# Patient Record
Sex: Female | Born: 1960 | Race: White | Hispanic: No | Marital: Single | Smoking: Former smoker
Health system: Southern US, Community
[De-identification: ages and names within clinical notes are randomized; demographics above are authoritative.]

## PROBLEM LIST (undated history)

## (undated) DIAGNOSIS — J449 Chronic obstructive pulmonary disease, unspecified: Secondary | ICD-10-CM

## (undated) DIAGNOSIS — R600 Localized edema: Secondary | ICD-10-CM

## (undated) HISTORY — PX: TONSILLECTOMY: SUR1361

---

## 2010-05-19 ENCOUNTER — Emergency Department (HOSPITAL_COMMUNITY)
Admission: EM | Admit: 2010-05-19 | Discharge: 2010-05-19 | Disposition: A | Discharge status: Home / Self Care | Payer: Self-pay | Attending: Emergency Medicine | Admitting: Emergency Medicine

## 2010-05-19 DIAGNOSIS — K029 Dental caries, unspecified: Secondary | ICD-10-CM | POA: Insufficient documentation

## 2010-05-19 DIAGNOSIS — K047 Periapical abscess without sinus: Secondary | ICD-10-CM | POA: Insufficient documentation

## 2010-05-19 DIAGNOSIS — R221 Localized swelling, mass and lump, neck: Secondary | ICD-10-CM | POA: Insufficient documentation

## 2010-05-19 DIAGNOSIS — R220 Localized swelling, mass and lump, head: Secondary | ICD-10-CM | POA: Insufficient documentation

## 2010-05-19 DIAGNOSIS — R51 Headache: Secondary | ICD-10-CM | POA: Insufficient documentation

## 2010-06-22 ENCOUNTER — Other Ambulatory Visit: Payer: Self-pay | Admitting: Obstetrics and Gynecology

## 2010-06-22 ENCOUNTER — Encounter (INDEPENDENT_AMBULATORY_CARE_PROVIDER_SITE_OTHER): Payer: Self-pay | Admitting: Obstetrics and Gynecology

## 2010-06-22 DIAGNOSIS — Z01419 Encounter for gynecological examination (general) (routine) without abnormal findings: Secondary | ICD-10-CM

## 2010-06-23 NOTE — Progress Notes (Signed)
NAME:  Evelyn Peterson, Evelyn Peterson                  ACCOUNT NO.:  [id number]  MEDICAL RECORD NO.:  [id number]           PATIENT TYPE:  A  LOCATION:  [facility]                   FACILITY:  [facility]  PHYSICIAN:  Catalina Antigua, MD     DATE OF BIRTH:  Jun 11, 1960  DATE OF SERVICE:  06/22/2010                                 CLINIC NOTE  This is a 50 year old postmenopausal, nulligravida patient who presents today for annual exam.  The patient is without any specific complaints. Denies abnormal bleeding or discharge.  The patient is not sexually active.  The patient gives a history of recent tooth abscess that has been treated with oral antibiotics.  The patient had also history of hip pain and states that following the completion of her antibiotic course the hip pain seems to have resolved and this was the reason why she was actually seeking medical care.  The patient does not have a primary care physician.  PAST MEDICAL HISTORY:  She denies.  MEDICAL HISTORY:  She has had a tonsillectomy.  PAST OBSTETRIC/GYNECOLOGICAL HISTORY:  She denies any cyst, fibroids, or any abnormal Pap smear.  She has been menopausal for at least 5 years and has not seek medical care since her teenage years.  FAMILY HISTORY:  Significant for diabetes, hypertension, and coronary artery disease.  SOCIAL HISTORY:  She is a current smoker over a pack a day for the past 33 years.  She drinks on the weekends, approximately a 6-pack and she denies any use of illicit drugs.  REVIEW OF SYSTEMS:  Otherwise significant for generalized joint pain.  PHYSICAL EXAMINATION:  VITAL SIGNS:  Her blood pressure is 151/84, pulse of 83, weight of 116.7 kg, and height of 68 inches. LUNGS:  Clear to auscultation bilaterally. HEART:  Regular rate and rhythm. BREASTS:  Nontender, equal in size.  No palpable masses or lymphadenopathy.  No expressible nipple discharge. ABDOMEN:  Soft, nontender, and nondistended, but obese. EXTREMITIES:   Nontender, equal in size. PELVIC:  She had normal-appearing vaginal mucosa and cervix.  No abnormal bleeding or discharge.  Pelvic exam is limited secondary to body habitus, but no appreciable masses or tenderness is elicited.  ASSESSMENT/PLAN:  This is a 50 year old, nulligravida, postmenopausal female who presents today for annual exam.  Pap smear was performed today.  Referral for a routine screening mammogram was ordered.  The patient was also advised to initiate care with primary care physician for evaluation of possible hypertension, although the patient reports that her blood pressure is otherwise typically 130s/70s.          ______________________________ Catalina Antigua, MD    PC/MEDQ  D:  06/22/2010  T:  06/23/2010  Job:  [id number]

## 2010-07-23 ENCOUNTER — Other Ambulatory Visit: Payer: Self-pay | Admitting: Obstetrics and Gynecology

## 2010-07-23 DIAGNOSIS — Z1231 Encounter for screening mammogram for malignant neoplasm of breast: Secondary | ICD-10-CM

## 2010-07-30 ENCOUNTER — Ambulatory Visit (HOSPITAL_COMMUNITY)
Admission: RE | Admit: 2010-07-30 | Discharge: 2010-07-30 | Disposition: A | Discharge status: Home / Self Care | Payer: Self-pay | Source: Ambulatory Visit | Attending: Obstetrics and Gynecology | Admitting: Obstetrics and Gynecology

## 2010-07-30 DIAGNOSIS — Z1231 Encounter for screening mammogram for malignant neoplasm of breast: Secondary | ICD-10-CM

## 2012-08-26 ENCOUNTER — Ambulatory Visit (INDEPENDENT_AMBULATORY_CARE_PROVIDER_SITE_OTHER): Payer: No Typology Code available for payment source | Admitting: Family Medicine

## 2012-08-26 ENCOUNTER — Ambulatory Visit: Payer: No Typology Code available for payment source

## 2012-08-26 ENCOUNTER — Ambulatory Visit (HOSPITAL_BASED_OUTPATIENT_CLINIC_OR_DEPARTMENT_OTHER)
Admission: RE | Admit: 2012-08-26 | Discharge: 2012-08-26 | Disposition: A | Discharge status: Home / Self Care | Payer: No Typology Code available for payment source | Source: Ambulatory Visit | Attending: Family Medicine | Admitting: Family Medicine

## 2012-08-26 VITALS — BP 145/83 | HR 84 | Temp 97.6°F | Resp 18 | Ht 66.0 in | Wt 265.0 lb

## 2012-08-26 DIAGNOSIS — M7989 Other specified soft tissue disorders: Secondary | ICD-10-CM | POA: Insufficient documentation

## 2012-08-26 DIAGNOSIS — R609 Edema, unspecified: Secondary | ICD-10-CM

## 2012-08-26 DIAGNOSIS — R209 Unspecified disturbances of skin sensation: Secondary | ICD-10-CM | POA: Insufficient documentation

## 2012-08-26 MED ORDER — NAPROXEN 500 MG PO TABS: 500.0000 mg | ORAL_TABLET | Freq: Two times a day (BID) | ORAL | Status: DC

## 2012-08-26 NOTE — Progress Notes (Signed)
[address]   [place] [place] [address], [place]  [address]   [id number]  Subjective:    Patient ID: Evelyn Peterson, female    DOB: 1960/05/31, 52 y.o.   MRN: [id number]  HPI This 52 y.o. female presents for evaluation of knee swelling R.  Onset of R knee swelling one week ago.  No injury; no pain. 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  Worse today.  No popping.  Seems to be above knee in thigh.  No thigh pain.  No giving out.  Walking at work without difficulty.  Calf and foot R was swelling today which is unusual.  No recent surgery; no prolonged immobilization.   No history of swelling.  Worried about blood clot.  Review of Systems  Constitutional: Negative for fever, chills, diaphoresis and fatigue.  Respiratory: Negative for shortness of breath and stridor.   Cardiovascular: Positive for leg swelling. Negative for chest pain and palpitations.  Musculoskeletal: Positive for myalgias, joint swelling, arthralgias and gait problem. Negative for back pain.  Skin: Negative for color change, pallor, rash and wound.   History reviewed. No pertinent past medical history. Past Surgical History  Procedure Laterality Date  . Tonsillectomy     History   Social History  . Marital Status: Single    Spouse Name: N/A    Number of Children: N/A  . Years of Education: N/A   Occupational History  .      Waitress   Social History Main Topics  . Smoking status: Current Every Day Smoker -- 1.00 packs/day    Types: Cigarettes  . Smokeless tobacco: Not on file  . Alcohol Use: Yes     Comment: beer- weekly  . Drug Use: No  . Sexually Active: Not on file   Other Topics Concern  . Not on file   Social History Narrative  . No narrative on file   No current outpatient prescriptions on file prior to visit.   No current facility-administered medications on file prior to visit.       Objective:   Physical Exam  Nursing note and vitals reviewed. Constitutional: She is oriented  to person, place, and time. She appears well-developed and well-nourished. No distress.  Cardiovascular: Normal rate, regular rhythm and normal heart sounds.  Exam reveals no gallop and no friction rub.   No murmur heard. Pulses:      Dorsalis pedis pulses are 2+ on the right side, and 2+ on the left side.  Capillary refill <  3 second B feet.  Pulmonary/Chest: Effort normal and breath sounds normal. She has no wheezes. She has no rales.  Musculoskeletal:       Left hip: Normal.       Left knee: She exhibits decreased range of motion and swelling. She exhibits no effusion, no laceration, no erythema, normal alignment, normal patellar mobility, normal meniscus and no MCL laxity. Tenderness found. Medial joint line and lateral joint line tenderness noted. No MCL, no LCL and no patellar tendon tenderness noted.       Lumbar back: Normal.       Left lower leg: She exhibits tenderness and swelling. She exhibits no bony tenderness, no deformity and no laceration.  Neurological: She is alert and oriented to person, place, and time.  Skin: Skin is warm and dry. No rash noted. She is not diaphoretic. No erythema. No pallor.  Psychiatric: She has a normal mood and affect. Her behavior is normal. Judgment and  thought content normal.      [facility] reading (PRIMARY) by  Dr. Katrinka Blazing.  R KNEE:  Mild narrowing medial joint line consistent with arthritic changes; NAD.   Assessment & Plan:  Edema - Plan: DG Knee Complete 4 Views Right, US Venous Img Lower Unilateral Right, CANCELED: US Venous Img Lower Unilateral Right  Right leg swelling - Plan: naproxen (NAPROSYN) 500 MG tablet   1. LLE swelling:  New.  Obtain LE doppler stat tonight.  If doppler negative, will treat as musculoskeletal etiology/pathology; treat with Naproxen, ice, elevation, rest.

## 2012-08-26 NOTE — Patient Instructions (Addendum)
Go to emergency department to REGISTER as outpatient for U/S doppler R lower extremity.        Driving directions to [facility] 3D2D  [phone]  - more info    [address]  [place], [place] [address]     1. Head south on [address] toward [address]      0.5 mi    2. Sharp left onto [address]      0.6 mi    3. Turn left onto the [employer] E ramp      0.2 mi    4. Merge onto [address]      3.0 mi    5. Continue straight to stay on [employer] E      0.4 mi    6. Slight left to stay on [address] E      1.2 mi    7. Turn right onto [address]      0.1 mi    8. Turn left onto [address]      361 ft    9. Take the 1st left onto [facility]  Destination will be on the right    Driving directions to [facility] 3D2D  [phone]  - more info    [address]  [place], [place] [address]     1. Head north on [address] toward [address]      344 ft    2. Turn right onto [address] [address]      0.3 mi    3. Slight left to stay on [address]      1.7 mi    4. Turn left onto [address]  Destination will be on the right     0.6 mi     [facility]  [address] [place]   Driving directions to [address] [place], [place], [place] [address] 3D2D  - more info    [address]  [place], [place] [address]     1. Head south on [address] toward [address]      0.5 mi    2. Sharp left onto [address]      0.6 mi    3. Turn left onto the [employer] E ramp      0.2 mi    4. Merge onto [address] E      3.0 mi    5. Continue straight to stay on [employer] E      0.4 mi    6. Slight left to stay on [facility]  Destination will be on the right     1.0 mi     [address] [place], [place] [address]   Driving directions to [facility] 3D2D  [phone]  - more info    [address]  [place], [place] [address]     1. Head south on [address] toward [address]      0.5 mi    2. Sharp left onto [address]      0.6 mi    3. Turn left onto the [employer] E ramp      0.2 mi    4. Merge onto [address] E      3.0 mi    5. Slight right toward [employer] (signs for  [address])      0.2 mi    6. Take the ramp to [employer] [address]      338 ft    7. Turn left onto [address]      0.3 mi    8. Turn left onto [facility] [address]  Destination will be on the right     0.2 mi     [facility]  [address]   Driving directions to [facility] 3D2D  - more info    [address]  [place], [place] [address]     1. Head south on [address] toward [address]      0.7 mi    2. Turn left onto [employer] [address]      0.4 mi    3. Take the 3rd right onto [facility] [address]      1.1 mi    4. Take the [address] ramp to [facility]      0.2 mi    5. Merge onto [address]      3.7 mi    6. Take exit [address] for [address] toward [place]      0.3 mi    7. Keep left at the fork, follow signs for Airport      381 ft    8. Keep left at the fork, follow signs for [facility] [address]      302 ft    9. Turn left onto [address]      2.6 mi    10. Turn right onto [employer] will be on the left     0.2 mi     [employer]

## 2018-03-28 ENCOUNTER — Emergency Department (HOSPITAL_COMMUNITY): Payer: Self-pay

## 2018-03-28 ENCOUNTER — Other Ambulatory Visit: Payer: Self-pay

## 2018-03-28 ENCOUNTER — Inpatient Hospital Stay (HOSPITAL_COMMUNITY)
Admission: EM | Admit: 2018-03-28 | Discharge: 2018-04-01 | DRG: 190 | Disposition: A | Discharge status: Home / Self Care | Payer: Self-pay | Attending: Internal Medicine | Admitting: Internal Medicine

## 2018-03-28 ENCOUNTER — Encounter (HOSPITAL_COMMUNITY): Payer: Self-pay | Admitting: *Deleted

## 2018-03-28 DIAGNOSIS — E669 Obesity, unspecified: Secondary | ICD-10-CM | POA: Diagnosis present

## 2018-03-28 DIAGNOSIS — R21 Rash and other nonspecific skin eruption: Secondary | ICD-10-CM | POA: Diagnosis present

## 2018-03-28 DIAGNOSIS — Z8249 Family history of ischemic heart disease and other diseases of the circulatory system: Secondary | ICD-10-CM

## 2018-03-28 DIAGNOSIS — M7989 Other specified soft tissue disorders: Secondary | ICD-10-CM

## 2018-03-28 DIAGNOSIS — I491 Atrial premature depolarization: Secondary | ICD-10-CM | POA: Diagnosis present

## 2018-03-28 DIAGNOSIS — Z833 Family history of diabetes mellitus: Secondary | ICD-10-CM

## 2018-03-28 DIAGNOSIS — Z822 Family history of deafness and hearing loss: Secondary | ICD-10-CM

## 2018-03-28 DIAGNOSIS — Z23 Encounter for immunization: Secondary | ICD-10-CM

## 2018-03-28 DIAGNOSIS — Z791 Long term (current) use of non-steroidal anti-inflammatories (NSAID): Secondary | ICD-10-CM

## 2018-03-28 DIAGNOSIS — Z6839 Body mass index (BMI) 39.0-39.9, adult: Secondary | ICD-10-CM

## 2018-03-28 DIAGNOSIS — L03115 Cellulitis of right lower limb: Secondary | ICD-10-CM

## 2018-03-28 DIAGNOSIS — J449 Chronic obstructive pulmonary disease, unspecified: Secondary | ICD-10-CM

## 2018-03-28 DIAGNOSIS — F17210 Nicotine dependence, cigarettes, uncomplicated: Secondary | ICD-10-CM | POA: Diagnosis present

## 2018-03-28 DIAGNOSIS — Z9089 Acquired absence of other organs: Secondary | ICD-10-CM

## 2018-03-28 DIAGNOSIS — J441 Chronic obstructive pulmonary disease with (acute) exacerbation: Secondary | ICD-10-CM | POA: Diagnosis present

## 2018-03-28 DIAGNOSIS — J9601 Acute respiratory failure with hypoxia: Secondary | ICD-10-CM | POA: Diagnosis present

## 2018-03-28 DIAGNOSIS — J189 Pneumonia, unspecified organism: Secondary | ICD-10-CM | POA: Diagnosis present

## 2018-03-28 DIAGNOSIS — J440 Chronic obstructive pulmonary disease with acute lower respiratory infection: Principal | ICD-10-CM | POA: Diagnosis present

## 2018-03-28 DIAGNOSIS — R609 Edema, unspecified: Secondary | ICD-10-CM | POA: Diagnosis present

## 2018-03-28 DIAGNOSIS — J45909 Unspecified asthma, uncomplicated: Secondary | ICD-10-CM

## 2018-03-28 DIAGNOSIS — Z716 Tobacco abuse counseling: Secondary | ICD-10-CM

## 2018-03-28 DIAGNOSIS — J4540 Moderate persistent asthma, uncomplicated: Secondary | ICD-10-CM | POA: Diagnosis present

## 2018-03-28 HISTORY — DX: Chronic obstructive pulmonary disease, unspecified: J44.9

## 2018-03-28 HISTORY — DX: Localized edema: R60.0

## 2018-03-28 LAB — I-STAT BETA HCG BLOOD, ED (MC, WL, AP ONLY): I-stat hCG, quantitative: <5 m[IU]/mL (ref <5)

## 2018-03-28 LAB — BASIC METABOLIC PANEL
Anion gap: 9 (ref 5–15)
BUN: 10 mg/dL (ref 6–20)
CO2: 37 mmol/L — ABNORMAL HIGH (ref 22–32)
Calcium: 8.9 mg/dL (ref 8.9–10.3)
Chloride: 96 mmol/L — ABNORMAL LOW (ref 98–111)
Creatinine, Ser: 0.69 mg/dL (ref 0.44–1.00)
GFR calc Af Amer: >60 mL/min (ref >60)
GFR calc non Af Amer: >60 mL/min (ref >60)
Glucose, Bld: 109 mg/dL — ABNORMAL HIGH (ref 70–99)
Potassium: 4.1 mmol/L (ref 3.5–5.1)
Sodium: 142 mmol/L (ref 135–145)

## 2018-03-28 LAB — RESPIRATORY PANEL BY PCR
Adenovirus: NOT DETECTED
BORDETELLA PERTUSSIS-RVPCR: NOT DETECTED
Chlamydophila pneumoniae: NOT DETECTED
Coronavirus 229E: NOT DETECTED
Coronavirus HKU1: NOT DETECTED
Coronavirus NL63: NOT DETECTED
Coronavirus OC43: NOT DETECTED
Influenza A: NOT DETECTED
Influenza B: NOT DETECTED
Metapneumovirus: NOT DETECTED
Mycoplasma pneumoniae: NOT DETECTED
PARAINFLUENZA VIRUS 4-RVPPCR: NOT DETECTED
Parainfluenza Virus 1: NOT DETECTED
Parainfluenza Virus 2: NOT DETECTED
Parainfluenza Virus 3: NOT DETECTED
Respiratory Syncytial Virus: NOT DETECTED
Rhinovirus / Enterovirus: NOT DETECTED

## 2018-03-28 LAB — I-STAT TROPONIN, ED: Troponin i, poc: 0.01 ng/mL (ref 0.00–0.08)

## 2018-03-28 LAB — CBC
HCT: 56.5 % — ABNORMAL HIGH (ref 36.0–46.0)
Hemoglobin: 17.5 g/dL — ABNORMAL HIGH (ref 12.0–15.0)
MCH: 32.2 pg (ref 26.0–34.0)
MCHC: 31 g/dL (ref 30.0–36.0)
MCV: 104.1 fL — ABNORMAL HIGH (ref 80.0–100.0)
Platelets: 258 10*3/uL (ref 150–400)
RBC: 5.43 MIL/uL — ABNORMAL HIGH (ref 3.87–5.11)
RDW: 14.1 % (ref 11.5–15.5)
WBC: 9.8 10*3/uL (ref 4.0–10.5)
nRBC: 0 % (ref 0.0–0.2)

## 2018-03-28 MED ORDER — METHYLPREDNISOLONE SODIUM SUCC 125 MG IJ SOLR
125.0000 mg | Freq: Once | INTRAMUSCULAR | Status: AC
  Administered 2018-03-28: 125 mg via INTRAVENOUS
  Filled 2018-03-28: qty 2

## 2018-03-28 MED ORDER — PREDNISONE 20 MG PO TABS
40.0000 mg | ORAL_TABLET | Freq: Every day | ORAL | Status: DC
  Administered 2018-03-29 – 2018-03-30 (×2): 40 mg via ORAL
  Filled 2018-03-28 (×2): qty 2

## 2018-03-28 MED ORDER — ALBUTEROL SULFATE (2.5 MG/3ML) 0.083% IN NEBU
5.0000 mg | INHALATION_SOLUTION | Freq: Once | RESPIRATORY_TRACT | Status: AC
  Administered 2018-03-28: 5 mg via RESPIRATORY_TRACT
  Filled 2018-03-28: qty 6

## 2018-03-28 MED ORDER — ALBUTEROL (5 MG/ML) CONTINUOUS INHALATION SOLN
15.0000 mg/h | INHALATION_SOLUTION | RESPIRATORY_TRACT | Status: AC
  Administered 2018-03-28: 15 mg/h via RESPIRATORY_TRACT
  Filled 2018-03-28: qty 20

## 2018-03-28 MED ORDER — CEFAZOLIN SODIUM-DEXTROSE 1-4 GM/50ML-% IV SOLN
1.0000 g | Freq: Once | INTRAVENOUS | Status: AC
  Administered 2018-03-28: 1 g via INTRAVENOUS
  Filled 2018-03-28: qty 50

## 2018-03-28 MED ORDER — ENOXAPARIN SODIUM 40 MG/0.4ML ~~LOC~~ SOLN
40.0000 mg | SUBCUTANEOUS | Status: DC
  Administered 2018-03-29 – 2018-03-31 (×3): 40 mg via SUBCUTANEOUS
  Filled 2018-03-28 (×2): qty 0.4

## 2018-03-28 MED ORDER — ALBUTEROL SULFATE (2.5 MG/3ML) 0.083% IN NEBU: 2.5000 mg | INHALATION_SOLUTION | RESPIRATORY_TRACT | Status: DC | PRN

## 2018-03-28 MED ORDER — IPRATROPIUM BROMIDE 0.02 % IN SOLN
0.5000 mg | Freq: Four times a day (QID) | RESPIRATORY_TRACT | Status: DC
  Administered 2018-03-29 (×2): 0.5 mg via RESPIRATORY_TRACT
  Filled 2018-03-28: qty 2.5

## 2018-03-28 MED ORDER — CEFAZOLIN SODIUM-DEXTROSE 1-4 GM/50ML-% IV SOLN: 1.0000 g | Freq: Three times a day (TID) | INTRAVENOUS | Status: DC

## 2018-03-28 MED ORDER — SODIUM CHLORIDE 0.9 % IV SOLN
2.0000 g | INTRAVENOUS | Status: DC
  Administered 2018-03-29 – 2018-03-30 (×2): 2 g via INTRAVENOUS
  Filled 2018-03-28 (×2): qty 20

## 2018-03-28 MED ORDER — IPRATROPIUM-ALBUTEROL 0.5-2.5 (3) MG/3ML IN SOLN
3.0000 mL | Freq: Once | RESPIRATORY_TRACT | Status: AC
  Administered 2018-03-28: 3 mL via RESPIRATORY_TRACT
  Filled 2018-03-28: qty 3

## 2018-03-28 MED ORDER — MOMETASONE FURO-FORMOTEROL FUM 200-5 MCG/ACT IN AERO
1.0000 | INHALATION_SPRAY | Freq: Two times a day (BID) | RESPIRATORY_TRACT | Status: DC
  Administered 2018-03-29 – 2018-03-30 (×2): 1 via RESPIRATORY_TRACT
  Filled 2018-03-28: qty 8.8

## 2018-03-28 NOTE — ED Provider Notes (Signed)
[facility]   CSN: [id number] Arrival date & time: 03/28/18  1515     History   Chief Complaint Chief Complaint  Patient presents with  . Leg Swelling    HPI Evelyn Peterson is a 57 y.o. female.  The history is provided by the patient. No language interpreter was used.  Leg Pain   This is a new problem. The problem occurs constantly. The pain is present in the right lower leg. The pain is moderate. Pertinent negatives include full range of motion. She has tried nothing for the symptoms. The treatment provided no relief. There has been no history of extremity trauma.  Pt reports she recently has had a cold and cough.  Pt reports she has felt tired.  Pt complains of swelling and redness to right lower leg.  Pt reports decreased activity since illness   History reviewed. No pertinent past medical history.  There are no active problems to display for this patient.   Past Surgical History:  Procedure Laterality Date  . TONSILLECTOMY       OB History   No obstetric history on file.      Home Medications    Prior to Admission medications   Medication Sig Start Date End Date Taking? Authorizing Provider  naproxen (NAPROSYN) 500 MG tablet Take 1 tablet (500 mg total) by mouth 2 (two) times daily with a meal. 08/26/12   Ethelda ChickSmith, Kristi M, MD    Family History Family History  Problem Relation Age of Onset  . Conductive hearing loss Mother   . Diabetes Mother   . Heart disease Mother        AMI/CHF  . Hypertension Mother   . Diabetes Father   . Diabetes Sister   . High blood pressure Sister   . High blood pressure Sister   . Diabetes Sister     Social History Social History   Tobacco Use  . Smoking status: Current Every Day Smoker    Packs/day: 1.00    Types: Cigarettes  . Smokeless tobacco: Never Used  Substance Use Topics  . Alcohol use: Yes    Comment: beer- weekly  . Drug use: No     Allergies     Patient has no known allergies.   Review of Systems Review of Systems  All other systems reviewed and are negative.    Physical Exam Updated Vital Signs BP 102/63 (BP Location: Right Arm)   Pulse 86   Temp 98.2 F (36.8 C) (Oral)   Resp (!) 22   SpO2 98%   Physical Exam Vitals signs and nursing note reviewed.  Constitutional:      Appearance: She is well-developed. She is obese.  HENT:     Head: Normocephalic.     Right Ear: External ear normal.     Left Ear: External ear normal.     Nose: Nose normal.     Mouth/Throat:     Mouth: Mucous membranes are moist.  Eyes:     Pupils: Pupils are equal, round, and reactive to light.  Neck:     Musculoskeletal: Normal range of motion.  Cardiovascular:     Rate and Rhythm: Normal rate.     Pulses: Normal pulses.  Pulmonary:     Effort: Pulmonary effort is normal.     Breath sounds: Wheezing and rhonchi present.  Abdominal:     General: There is no distension.     Tenderness: There is no abdominal  tenderness. There is no rebound.  Musculoskeletal: Normal range of motion.        General: Swelling and tenderness present.     Right lower leg: Edema present.     Comments: Swollen red lower right leg,   Neurological:     Mental Status: She is alert and oriented to person, place, and time.  Psychiatric:        Mood and Affect: Mood normal.      ED Treatments / Results  Labs (all labs ordered are listed, but only abnormal results are displayed) Labs Reviewed  BASIC METABOLIC PANEL - Abnormal; Notable for the following components:      Result Value   Chloride 96 (*)    CO2 37 (*)    Glucose, Bld 109 (*)    All other components within normal limits  CBC - Abnormal; Notable for the following components:   RBC 5.43 (*)    Hemoglobin 17.5 (*)    HCT 56.5 (*)    MCV 104.1 (*)    All other components within normal limits  I-STAT TROPONIN, ED  I-STAT BETA HCG BLOOD, ED ([facility], [facility], [facility] ONLY)    EKG EKG  Interpretation  Date/Time:  Tuesday March 28 2018 15:27:19 EST Ventricular Rate:  102 PR Interval:  148 QRS Duration: 90 QT Interval:  372 QTC Calculation: 484 R Axis:   -133 Text Interpretation:  Sinus tachycardia with Premature atrial complexes with Abberant conduction Right superior axis deviation Septal infarct , age undetermined Abnormal ECG No previous ECGs available Confirmed by Frederick PeersLittle, Rachel [phone]([id number]) on 03/28/2018 4:03:42 PM   Radiology Dg Chest 2 View  Result Date: 03/28/2018 CLINICAL DATA:  Shortness of breath EXAM: CHEST - 2 VIEW COMPARISON:  08/26/2014 FINDINGS: Borderline heart size. Negative aortic and hilar contours. Large lung volumes on the lateral view. There is no edema, consolidation, effusion, or pneumothorax. IMPRESSION: No evidence of acute disease. Electronically Signed   By: Marnee SpringJonathon  Watts M.D.   On: 03/28/2018 16:17    Procedures Procedures (including critical care time)  Medications Ordered in ED Medications - No data to display   Initial Impression / Assessment and Plan / ED Course  I have reviewed the triage vital signs and the nursing notes.  Pertinent labs & imaging results that were available during my care of the patient were reviewed by me and considered in my medical decision making (see chart for details).     MDM  Pt given IV Ancef.  Ultrasound shows no evidence of DVT.  Pt given duoneb.  Pt given albuterol 5mg .  Pt has continued wheezing.  Pt given 1 hour continuous neb treatment.  Pt reports she still feels short of breath.  Pt off 02 and sats decreased to 84.  I will consult unassigned for admission. Solumedrol IV  Dr. Julian ReilGardner Hospitalist will see for admission  Final Clinical Impressions(s) / ED Diagnoses   Final diagnoses:  Cellulitis of right lower leg  Chronic obstructive pulmonary disease, unspecified COPD type (HCC)  Moderate asthma without complication, unspecified whether persistent    ED Discharge Orders    None        Osie CheeksSofia, Leslie K, PA-C 03/28/18 2311    Little, Ambrose Finlandachel Morgan, MD 03/31/18 1115

## 2018-03-28 NOTE — Progress Notes (Signed)
*  Preliminary Results* Right lower extremity venous duplex completed. Right lower extremity is negative for deep vein thrombosis. There is no evidence of right Baker's cyst.  03/28/2018 5:44 PM  Gertie FeyMichelle Tyler Robidoux, MHA, RVT, RDCS, RDMS

## 2018-03-28 NOTE — ED Triage Notes (Signed)
Pt is here with lower extremity swelling and now feels like she is swelling all over and yesterday noticed a bad rash on her legs

## 2018-03-28 NOTE — H&P (Addendum)
History and Physical    Evelyn Peterson ZOX:[id number] DOB: May 29, 1960 DOA: 03/28/2018  PCP: Patient, No Pcp Per  Patient coming from: Home  I have personally briefly reviewed patient's old medical records in [facility]  Chief Complaint: SOB  HPI: Evelyn Peterson is a 57 y.o. female with medical history significant of smoking.  Patient presents to the ED with c/o BLE swelling and RLE rash.  She has been having intermittent swelling to BLE for the past 1 month or so.  Yesterday got much worse with development of rash to anterior RLE.  Patient has never been diagnosed with COPD nor with CHF.  ED Course: Patient wheezing.  EDP treated as COPD exacerbation, gave solumedrol, neb treatments.  Patient noted to be satting mid 80s on RA, put on O2.  CXR neg.  Trop neg.   Review of Systems: As per HPI otherwise 10 point review of systems negative.   History reviewed. No pertinent past medical history.  Past Surgical History:  Procedure Laterality Date  . TONSILLECTOMY       reports that she has been smoking cigarettes. She has been smoking about 1.00 pack per day. She has never used smokeless tobacco. She reports current alcohol use. She reports that she does not use drugs.  No Known Allergies  Family History  Problem Relation Age of Onset  . Conductive hearing loss Mother   . Diabetes Mother   . Heart disease Mother        AMI/CHF  . Hypertension Mother   . Diabetes Father   . Diabetes Sister   . High blood pressure Sister   . High blood pressure Sister   . Diabetes Sister      Prior to Admission medications   Medication Sig Start Date End Date Taking? Authorizing Provider  naproxen (NAPROSYN) 500 MG tablet Take 1 tablet (500 mg total) by mouth 2 (two) times daily with a meal. Patient not taking: Reported on 03/28/2018 08/26/12   Ethelda Chick, MD    Physical Exam: Vitals:   03/28/18 1544 03/28/18 1549 03/28/18 1756 03/28/18 2030  BP:  102/63    Pulse:  86    Resp:   (!) 22    Temp:      TempSrc:      SpO2: 93% 98% 97% 94%    Constitutional: NAD, calm, comfortable Eyes: PERRL, lids and conjunctivae normal ENMT: Mucous membranes are moist. Posterior pharynx clear of any exudate or lesions.Normal dentition.  Neck: normal, supple, no masses, no thyromegaly Respiratory: Diffuse wheezing, rhonchi Cardiovascular: 3+ pitting edema BLE Abdomen: no tenderness, no masses palpated. No hepatosplenomegaly. Bowel sounds positive.  Musculoskeletal: no clubbing / cyanosis. No joint deformity upper and lower extremities. Good ROM, no contractures. Normal muscle tone.  Skin: Erythema RLE. Neurologic: CN 2-12 grossly intact. Sensation intact, DTR normal. Strength 5/5 in all 4.  Psychiatric: Normal judgment and insight. Alert and oriented x 3. Normal mood.    Labs on Admission: I have personally reviewed following labs and imaging studies  CBC: Recent Labs  Lab 03/28/18 1529  WBC 9.8  HGB 17.5*  HCT 56.5*  MCV 104.1*  PLT 258   Basic Metabolic Panel: Recent Labs  Lab 03/28/18 1529  NA 142  K 4.1  CL 96*  CO2 37*  GLUCOSE 109*  BUN 10  CREATININE 0.69  CALCIUM 8.9   GFR: CrCl cannot be calculated (Unknown ideal weight.). Liver Function Tests: No results for input(s): AST, ALT, ALKPHOS, BILITOT, PROT, ALBUMIN  in the last 168 hours. No results for input(s): LIPASE, AMYLASE in the last 168 hours. No results for input(s): AMMONIA in the last 168 hours. Coagulation Profile: No results for input(s): INR, PROTIME in the last 168 hours. Cardiac Enzymes: No results for input(s): CKTOTAL, CKMB, CKMBINDEX, TROPONINI in the last 168 hours. BNP (last 3 results) No results for input(s): PROBNP in the last 8760 hours. HbA1C: No results for input(s): HGBA1C in the last 72 hours. CBG: No results for input(s): GLUCAP in the last 168 hours. Lipid Profile: No results for input(s): CHOL, HDL, LDLCALC, TRIG, CHOLHDL, LDLDIRECT in the last 72 hours. Thyroid  Function Tests: No results for input(s): TSH, T4TOTAL, FREET4, T3FREE, THYROIDAB in the last 72 hours. Anemia Panel: No results for input(s): VITAMINB12, FOLATE, FERRITIN, TIBC, IRON, RETICCTPCT in the last 72 hours. Urine analysis: No results found for: COLORURINE, APPEARANCEUR, LABSPEC, PHURINE, GLUCOSEU, HGBUR, BILIRUBINUR, KETONESUR, PROTEINUR, UROBILINOGEN, NITRITE, LEUKOCYTESUR  Radiological Exams on Admission: Dg Chest 2 View  Result Date: 03/28/2018 CLINICAL DATA:  Shortness of breath EXAM: CHEST - 2 VIEW COMPARISON:  08/26/2014 FINDINGS: Borderline heart size. Negative aortic and hilar contours. Large lung volumes on the lateral view. There is no edema, consolidation, effusion, or pneumothorax. IMPRESSION: No evidence of acute disease. Electronically Signed   By: Marnee SpringJonathon  Watts M.D.   On: 03/28/2018 16:17   Vas Koreas Lower Extremity Venous (dvt) (only [facility])  Result Date: 03/28/2018  Lower Venous Study Indications: Swelling.  Performing Technologist: Gertie FeyMichelle Simonetti MHA, RDMS, RVT, RDCS  Examination Guidelines: A complete evaluation includes B-mode imaging, spectral Doppler, color Doppler, and power Doppler as needed of all accessible portions of each vessel. Bilateral testing is considered an integral part of a complete examination. Limited examinations for reoccurring indications may be performed as noted.  Right Venous Findings: +---------+---------------+---------+-----------+----------+--------------+          CompressibilityPhasicitySpontaneityPropertiesSummary        +---------+---------------+---------+-----------+----------+--------------+ CFV      Full           No       Yes                  Pulsatile flow +---------+---------------+---------+-----------+----------+--------------+ SFJ      Full                                                        +---------+---------------+---------+-----------+----------+--------------+ FV Prox  Full                                                         +---------+---------------+---------+-----------+----------+--------------+ FV Mid   Full                                                        +---------+---------------+---------+-----------+----------+--------------+ FV DistalFull                                                        +---------+---------------+---------+-----------+----------+--------------+  PFV      Full                                                        +---------+---------------+---------+-----------+----------+--------------+ POP      Full           No       Yes                  Pulsatile flow +---------+---------------+---------+-----------+----------+--------------+ PTV      Full                                                        +---------+---------------+---------+-----------+----------+--------------+ PERO     Full                                                        +---------+---------------+---------+-----------+----------+--------------+  Left Venous Findings: +---+---------------+---------+-----------+----------+-------+    CompressibilityPhasicitySpontaneityPropertiesSummary +---+---------------+---------+-----------+----------+-------+ CFVFull           Yes      Yes                          +---+---------------+---------+-----------+----------+-------+    Summary: Right: There is no evidence of deep vein thrombosis in the lower extremity. No cystic structure found in the popliteal fossa. Left: No evidence of common femoral vein obstruction.  *See table(s) above for measurements and observations.    Preliminary     EKG: Independently reviewed.  Assessment/Plan Principal Problem:   COPD with acute exacerbation (HCC) Active Problems:   Peripheral edema   Acute respiratory failure with hypoxia (HCC)    1. COPD exacerbation - 1. COPD pathway 2. RVP neg 3. Prednisone 4. Rocephin 5. PRN and scheduled nebs 6. Cont  pulse ox 2. Peripheral edema - 1. [place] neg for DVT 2. 2d echo ordered 3. LFTs ordered to look at albumin 4. UA ordered to look for proteinuria 1. Creat looks okay at least 5. CXR neg for pulm edema 6. BNP ordered 3. Acute resp failure with new O2 requirement - 1. O2 via [place] 2. Cont pulse ox 3. Also suspect a chronic hypercapnic component too given CO2 of 37  DVT prophylaxis: Lovenox Code Status: Full Family Communication: Family at bedside Disposition Plan: Home after admit Consults called: None Admission status: Admit to inpatient  Severity of Illness: The appropriate patient status for this patient is INPATIENT. Inpatient status is judged to be reasonable and necessary in order to provide the required intensity of service to ensure the patient's safety. The patient's presenting symptoms, physical exam findings, and initial radiographic and laboratory data in the context of their chronic comorbidities is felt to place them at high risk for further clinical deterioration. Furthermore, it is not anticipated that the patient will be medically stable for discharge from the hospital within 2 midnights of admission. The following factors support the patient status of inpatient.   " The patient's presenting symptoms include SOB. " The worrisome physical exam  findings include wheezing, rhonchi, 3+ pitting edema, O2 sat in the 80s on RA, new O2 requirement.   * I certify that at the point of admission it is my clinical judgment that the patient will require inpatient hospital care spanning beyond 2 midnights from the point of admission due to high intensity of service, high risk for further deterioration and high frequency of surveillance required.Hillary Bow*    Gargi Berch M. DO [facility] Pager [phone] Only works nights!  If 7AM-7PM, please contact the primary day team physician taking care of patient  [email or web address] Password [email or web address]  03/28/2018, 11:38 PM

## 2018-03-28 NOTE — ED Triage Notes (Signed)
Placed patient on 02/2L for sob and initial sats at 85-86 RA.

## 2018-03-28 NOTE — ED Notes (Signed)
Pt placed on 02 via [place] at 2LPM 

## 2018-03-28 NOTE — ED Notes (Signed)
Pt to venous doppler at this time

## 2018-03-29 ENCOUNTER — Inpatient Hospital Stay (HOSPITAL_COMMUNITY): Payer: Self-pay

## 2018-03-29 DIAGNOSIS — R0602 Shortness of breath: Secondary | ICD-10-CM

## 2018-03-29 DIAGNOSIS — R600 Localized edema: Secondary | ICD-10-CM

## 2018-03-29 HISTORY — DX: Localized edema: R60.0

## 2018-03-29 LAB — URINALYSIS, ROUTINE W REFLEX MICROSCOPIC
Bilirubin Urine: NEGATIVE
Glucose, UA: NEGATIVE mg/dL
Hgb urine dipstick: NEGATIVE
Ketones, ur: NEGATIVE mg/dL
Leukocytes, UA: NEGATIVE
Nitrite: NEGATIVE
Protein, ur: NEGATIVE mg/dL
Specific Gravity, Urine: 1.011 (ref 1.005–1.030)
pH: 7 (ref 5.0–8.0)

## 2018-03-29 LAB — ECHOCARDIOGRAM COMPLETE

## 2018-03-29 LAB — HEPATIC FUNCTION PANEL
ALT: 29 U/L (ref 0–44)
AST: 26 U/L (ref 15–41)
Albumin: 3 g/dL — ABNORMAL LOW (ref 3.5–5.0)
Alkaline Phosphatase: 53 U/L (ref 38–126)
Bilirubin, Direct: 0.2 mg/dL (ref 0.0–0.2)
Indirect Bilirubin: 0.7 mg/dL (ref 0.3–0.9)
Total Bilirubin: 0.9 mg/dL (ref 0.3–1.2)
Total Protein: 6.1 g/dL — ABNORMAL LOW (ref 6.5–8.1)

## 2018-03-29 LAB — HIV ANTIBODY (ROUTINE TESTING W REFLEX): HIV Screen 4th Generation wRfx: NONREACTIVE

## 2018-03-29 LAB — BRAIN NATRIURETIC PEPTIDE: B Natriuretic Peptide: 294.7 pg/mL — ABNORMAL HIGH (ref 0.0–100.0)

## 2018-03-29 MED ORDER — PERFLUTREN LIPID MICROSPHERE
1.0000 mL | INTRAVENOUS | Status: AC | PRN
  Administered 2018-03-29: 2 mL via INTRAVENOUS
  Filled 2018-03-29: qty 10

## 2018-03-29 MED ORDER — NICOTINE 21 MG/24HR TD PT24
21.0000 mg | MEDICATED_PATCH | Freq: Every day | TRANSDERMAL | Status: DC
  Administered 2018-03-31 – 2018-04-01 (×2): 21 mg via TRANSDERMAL
  Filled 2018-03-29 (×3): qty 1

## 2018-03-29 MED ORDER — IPRATROPIUM-ALBUTEROL 0.5-2.5 (3) MG/3ML IN SOLN
3.0000 mL | Freq: Three times a day (TID) | RESPIRATORY_TRACT | Status: DC
  Administered 2018-03-29 – 2018-03-30 (×3): 3 mL via RESPIRATORY_TRACT
  Filled 2018-03-29 (×3): qty 3

## 2018-03-29 NOTE — ED Notes (Signed)
Ordered breakfast tray, diet heart healthy  

## 2018-03-29 NOTE — Progress Notes (Signed)
  Echocardiogram 2D Echocardiogram has been performed.  Evelyn Peterson L Androw 03/29/2018, 11:35 AM

## 2018-03-29 NOTE — ED Notes (Signed)
Pt ambulated by herself to bathroom.  Upon return, sats of 85% on RA.  Improved to 94% on 3L after several minutes.  Pt denied any sob.

## 2018-03-29 NOTE — ED Notes (Signed)
Attempted report 

## 2018-03-29 NOTE — Evaluation (Signed)
Physical Therapy Evaluation Patient Details Name: Evelyn Peterson MRN: [id number] DOB: April 01, 1960 Today's Date: 03/29/2018   History of Present Illness  Dasiah Hooley is a 58 y.o. female with medical history significant of smoking.  Patient presents to the ED with c/o BLE swelling and RLE rash.  She has been having intermittent swelling to BLE for the past 1 month or so.  Yesterday got much worse with development of rash to anterior RLE.    Clinical Impression  Patient evaluated by Physical Therapy with no further acute PT needs identified. All education has been completed and the patient has no further questions. 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 Today desatting on RA at rest and with activity to low 80s, 2L remains at 94%. See below for any follow-up Physical Therapy or equipment needs. PT is signing off. Thank you for this referral.  HR 90-105 this visit.       Follow Up Recommendations Other (comment)(OP cardiopulmonary rehab)    Equipment Recommendations  None recommended by PT    Recommendations for Other Services       Precautions / Restrictions Precautions Precaution Comments: Watch O2      Mobility  Bed Mobility Overal bed mobility: Independent                Transfers Overall transfer level: Independent                  Ambulation/Gait Ambulation/Gait assistance: Independent Gait Distance (Feet): 60 Feet Assistive device: None Gait Pattern/deviations: WFL(Within Functional Limits) Gait velocity: normal   General Gait Details: desats to 85% on RA walking. WNL gait pattern  Stairs            Wheelchair Mobility    Modified Rankin (Stroke Patients Only)       Balance Overall balance assessment: Independent                                           Pertinent Vitals/Pain Pain Assessment: No/denies pain    Home Living Family/patient expects to be discharged to:: Private residence Living  Arrangements: (lives brother sister in Sports [place]) Available Help at Discharge: Family;Available 24 hours/day Type of Home: House Home Access: Stairs to enter Entrance Stairs-Rails: Can reach both   Home Layout: Two level Home Equipment: None      Prior Function Level of Independence: Independent         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    Extremity/Trunk Assessment   Upper Extremity Assessment Upper Extremity Assessment: Overall WFL for tasks assessed    Lower Extremity Assessment Lower Extremity Assessment: Overall WFL for tasks assessed       Communication   Communication: No difficulties  Cognition Arousal/Alertness: Awake/alert Behavior During Therapy: WFL for tasks assessed/performed Overall Cognitive Status: Within Functional Limits for tasks assessed                                        General Comments      Exercises     Assessment/Plan    PT Assessment All further PT needs can be met in the next venue of care  PT Problem List Decreased activity tolerance  PT Treatment Interventions      PT Goals (Current goals can be found in the Care Plan section)  Acute Rehab PT Goals Patient Stated Goal: go home when able PT Goal Formulation: With patient Time For Goal Achievement: 03/29/18    Frequency     Barriers to discharge        Co-evaluation               AM-PAC PT "6 Clicks" Mobility  Outcome Measure Help needed turning from your back to your side while in a flat bed without using bedrails?: None Help needed moving from lying on your back to sitting on the side of a flat bed without using bedrails?: None Help needed moving to and from a bed to a chair (including a wheelchair)?: None Help needed standing up from a chair using your arms (e.g., wheelchair or bedside chair)?: None Help needed to walk in hospital room?: None Help needed climbing 3-5 steps with a railing? : A  Little 6 Click Score: 23    End of Session Equipment Utilized During Treatment: Gait belt Activity Tolerance: Patient tolerated treatment well Patient left: in bed Nurse Communication: Mobility status PT Visit Diagnosis: Unsteadiness on feet (R26.81)    Time: 8891-6945 PT Time Calculation (min) (ACUTE ONLY): 23 min   Charges:   PT Evaluation $PT Eval Low Complexity: 1 Low PT Treatments $Gait Training: 8-22 mins       Reinaldo Berber, PT, DPT Acute Rehabilitation Services Pager: [phone] Office: [place] 03/29/2018, 5:57 PM

## 2018-03-29 NOTE — ED Notes (Signed)
Pt in echo currently. RN to meet when pt returns.

## 2018-03-29 NOTE — ED Notes (Signed)
Heart Healthy Diet was ordered for Lunch. 

## 2018-03-29 NOTE — Progress Notes (Signed)
PROGRESS NOTE    Evelyn Peterson  ZOX:[id number] DOB: 01/09/1961 DOA: 03/28/2018 PCP: Patient, No Pcp Per   Brief Narrative:  Evelyn Peterson is Evelyn Peterson 58 y.o. female with medical history significant of smoking.  Patient presents to the ED with c/o BLE swelling and RLE rash.  She has been having intermittent swelling to BLE for the past 1 month or so.  Yesterday got much worse with development of rash to anterior RLE.  Patient has never been diagnosed with COPD nor with CHF.  ED Course: Patient wheezing.  EDP treated as COPD exacerbation, gave solumedrol, neb treatments.  Patient noted to be satting mid 80s on RA, put on O2.   Assessment & Plan:   Principal Problem:   COPD with acute exacerbation (HCC) Active Problems:   Peripheral edema   Acute respiratory failure with hypoxia (HCC)   1. COPD exacerbation - 1. RVP neg 2. Prednisone 3. Rocephin 4. PRN and scheduled nebs 5. Cont pulse ox 6. Needs outpatient PFT's 7. Encouraged smoking cessation  2. Peripheral edema  Right Lower Extremity Erythema- 1. US neg for DVT 2. 2d echo with normal EF 3. Albumin mildly low 4. UA without proteinuria 5. She notes her edema is improved now 6. CXR neg for pulm edema 7. BNP mildly elevated, but she appears euvolemic 8. With her erythematous lower extremity rash, would recommend outpatient dermatology follow up if this does not resolve.  It does not appear totally c/w cellulitis, but she's on abx at this time.  3. Acute resp failure with new O2 requirement - 1. As above 2. O2 via [place] 3. Cont pulse ox 4. Also suspect Evelyn Peterson chronic hypercapnic component too given CO2 of 37   DVT prophylaxis: lovenox Code Status: full  Family Communication: none at bedside Disposition Plan: pending further improvement in resp status, continuing to require supplemental O2   Consultants:   none  Procedures:  Echo Study Conclusions  - Procedure narrative: Transthoracic echocardiography. Image   quality was  poor. Intravenous contrast (Definity) was   administered. - Left ventricle: The cavity size was normal. Wall thickness was   normal. Systolic function was normal. The estimated ejection   fraction was in the range of 60% to 65%. Wall motion was normal;   there were no regional wall motion abnormalities. Left   ventricular diastolic function parameters were normal. - Aortic valve: Valve area (VTI): 2.33 cm^2. Valve area (Vmax):   2.53 cm^2. Valve area (Vmean): 2.34 cm^2. - Right ventricle: The cavity size was mildly to moderately   dilated. - Right atrium: The atrium was mildly dilated.  LE US Summary: Right: There is no evidence of deep vein thrombosis in the lower extremity. No cystic structure found in the popliteal fossa. Left: No evidence of common femoral vein obstruction.  Antimicrobials:  Anti-infectives (From admission, onward)   Start     Dose/Rate Route Frequency Ordered Stop   03/29/18 0000  cefTRIAXone (ROCEPHIN) 2 g in sodium chloride 0.9 % 100 mL IVPB     2 g 200 mL/hr over 30 Minutes Intravenous Every 24 hours 03/28/18 2332 04/02/18 2359   03/28/18 2200  ceFAZolin (ANCEF) IVPB 1 g/50 mL premix  Status:  Discontinued     1 g 100 mL/hr over 30 Minutes Intravenous Every 8 hours 03/28/18 1856 03/28/18 2146   03/28/18 2200  ceFAZolin (ANCEF) IVPB 1 g/50 mL premix     1 g 100 mL/hr over 30 Minutes Intravenous  Once 03/28/18 2146 03/28/18 2242  Subjective: SOB is better, but persistent, continues to require supplemental O2. LE edema is Evelyn Peterson bit better, redness persistent  Objective: Vitals:   03/29/18 1000 03/29/18 1030 03/29/18 1345 03/29/18 1456  BP: (!) 107/52 134/66 (!) 145/69   Pulse: 80 77 80 75  Resp: 17 (!) 29 (!) 24 20  Temp:   98 F (36.7 C)   TempSrc:   Oral   SpO2: 93% 91% 93% 94%  Weight:   118.4 kg   Height:   5\' 8"  (1.727 m)     Intake/Output Summary (Last 24 hours) at 03/29/2018 1846 Last data filed at 03/29/2018 1213 Gross per 24 hour  Intake  100 ml  Output -  Net 100 ml   Filed Weights   03/29/18 1345  Weight: 118.4 kg    Examination:  General exam: Appears calm and comfortable  Respiratory system: slightly increased wob, diffuse wheezing Cardiovascular system: S1 & S2 heard, RRR.  Gastrointestinal system: Abdomen is nondistended, soft and nontender. Central nervous system: Alert and oriented. No focal neurological deficits. Extremities: RLE with erythema, non palpable, not easily blanchable (edema noted to improve by pt) Psychiatry: Judgement and insight appear normal. Mood & affect appropriate.     Data Reviewed: I have personally reviewed following labs and imaging studies  CBC: Recent Labs  Lab 03/28/18 1529  WBC 9.8  HGB 17.5*  HCT 56.5*  MCV 104.1*  PLT 258   Basic Metabolic Panel: Recent Labs  Lab 03/28/18 1529  NA 142  K 4.1  CL 96*  CO2 37*  GLUCOSE 109*  BUN 10  CREATININE 0.69  CALCIUM 8.9   GFR: Estimated Creatinine Clearance: 102.4 mL/min (by C-G formula based on SCr of 0.69 mg/dL). Liver Function Tests: Recent Labs  Lab 03/29/18 0203  AST 26  ALT 29  ALKPHOS 53  BILITOT 0.9  PROT 6.1*  ALBUMIN 3.0*   No results for input(s): LIPASE, AMYLASE in the last 168 hours. No results for input(s): AMMONIA in the last 168 hours. Coagulation Profile: No results for input(s): INR, PROTIME in the last 168 hours. Cardiac Enzymes: No results for input(s): CKTOTAL, CKMB, CKMBINDEX, TROPONINI in the last 168 hours. BNP (last 3 results) No results for input(s): PROBNP in the last 8760 hours. HbA1C: No results for input(s): HGBA1C in the last 72 hours. CBG: No results for input(s): GLUCAP in the last 168 hours. Lipid Profile: No results for input(s): CHOL, HDL, LDLCALC, TRIG, CHOLHDL, LDLDIRECT in the last 72 hours. Thyroid Function Tests: No results for input(s): TSH, T4TOTAL, FREET4, T3FREE, THYROIDAB in the last 72 hours. Anemia Panel: No results for input(s): VITAMINB12, FOLATE,  FERRITIN, TIBC, IRON, RETICCTPCT in the last 72 hours. Sepsis Labs: No results for input(s): PROCALCITON, LATICACIDVEN in the last 168 hours.  Recent Results (from the past 240 hour(s))  Respiratory Panel by PCR     Status: None   Collection Time: 03/28/18  8:16 PM  Result Value Ref Range Status   Adenovirus NOT DETECTED NOT DETECTED Final   Coronavirus 229E NOT DETECTED NOT DETECTED Final   Coronavirus HKU1 NOT DETECTED NOT DETECTED Final   Coronavirus NL63 NOT DETECTED NOT DETECTED Final   Coronavirus OC43 NOT DETECTED NOT DETECTED Final   Metapneumovirus NOT DETECTED NOT DETECTED Final   Rhinovirus / Enterovirus NOT DETECTED NOT DETECTED Final   Influenza Ereka Peterson NOT DETECTED NOT DETECTED Final   Influenza B NOT DETECTED NOT DETECTED Final   Parainfluenza Virus 1 NOT DETECTED NOT DETECTED Final  Parainfluenza Virus 2 NOT DETECTED NOT DETECTED Final   Parainfluenza Virus 3 NOT DETECTED NOT DETECTED Final   Parainfluenza Virus 4 NOT DETECTED NOT DETECTED Final   Respiratory Syncytial Virus NOT DETECTED NOT DETECTED Final   Bordetella pertussis NOT DETECTED NOT DETECTED Final   Chlamydophila pneumoniae NOT DETECTED NOT DETECTED Final   Mycoplasma pneumoniae NOT DETECTED NOT DETECTED Final    Comment: Performed at [facility], [address], [place], [place] [address]  Blood culture (routine x 2)     Status: None (Preliminary result)   Collection Time: 03/28/18  8:40 PM  Result Value Ref Range Status   Specimen Description BLOOD RIGHT HAND  Final   Special Requests   Final    BOTTLES DRAWN AEROBIC AND ANAEROBIC Blood Culture adequate volume   Culture   Final    NO GROWTH < 24 HOURS Performed at [facility], [address], [place], [place] [address]    Report Status PENDING  Incomplete  Blood culture (routine x 2)     Status: None (Preliminary result)   Collection Time: 03/28/18  9:05 PM  Result Value Ref Range Status   Specimen Description BLOOD LEFT ANTECUBITAL   Final   Special Requests   Final    BOTTLES DRAWN AEROBIC AND ANAEROBIC Blood Culture adequate volume   Culture   Final    NO GROWTH < 24 HOURS Performed at [facility], [address], [place], [place] [address]    Report Status PENDING  Incomplete         Radiology Studies: Dg Chest 2 View  Result Date: 03/28/2018 CLINICAL DATA:  Shortness of breath EXAM: CHEST - 2 VIEW COMPARISON:  08/26/2014 FINDINGS: Borderline heart size. Negative aortic and hilar contours. Large lung volumes on the lateral view. There is no edema, consolidation, effusion, or pneumothorax. IMPRESSION: No evidence of acute disease. Electronically Signed   By: Marnee Spring M.D.   On: 03/28/2018 16:17   Vas [place] Lower Extremity Venous (dvt) (only [facility])  Result Date: 03/29/2018  Lower Venous Study Indications: Swelling.  Performing Technologist: Evelyn Peterson MHA, RDMS, RVT, RDCS  Examination Guidelines: Evelyn Peterson complete evaluation includes B-mode imaging, spectral Doppler, color Doppler, and power Doppler as needed of all accessible portions of each vessel. Bilateral testing is considered an integral part of Evelyn Peterson complete examination. Limited examinations for reoccurring indications may be performed as noted.  Right Venous Findings: +---------+---------------+---------+-----------+----------+--------------+          CompressibilityPhasicitySpontaneityPropertiesSummary        +---------+---------------+---------+-----------+----------+--------------+ CFV      Full           No       Yes                  Pulsatile flow +---------+---------------+---------+-----------+----------+--------------+ SFJ      Full                                                        +---------+---------------+---------+-----------+----------+--------------+ FV Prox  Full                                                        +---------+---------------+---------+-----------+----------+--------------+  FV Mid   Full                                                         +---------+---------------+---------+-----------+----------+--------------+ FV DistalFull                                                        +---------+---------------+---------+-----------+----------+--------------+ PFV      Full                                                        +---------+---------------+---------+-----------+----------+--------------+ POP      Full           No       Yes                  Pulsatile flow +---------+---------------+---------+-----------+----------+--------------+ PTV      Full                                                        +---------+---------------+---------+-----------+----------+--------------+ PERO     Full                                                        +---------+---------------+---------+-----------+----------+--------------+  Left Venous Findings: +---+---------------+---------+-----------+----------+-------+    CompressibilityPhasicitySpontaneityPropertiesSummary +---+---------------+---------+-----------+----------+-------+ CFVFull           Yes      Yes                          +---+---------------+---------+-----------+----------+-------+    Summary: Right: There is no evidence of deep vein thrombosis in the lower extremity. No cystic structure found in the popliteal fossa. Left: No evidence of common femoral vein obstruction.  *See table(s) above for measurements and observations. Electronically signed by Evelyn Livings MD on 03/29/2018 at 11:16:55 AM.    Final         Scheduled Meds: . enoxaparin (LOVENOX) injection  40 mg Subcutaneous Q24H  . ipratropium-albuterol  3 mL Nebulization TID  . mometasone-formoterol  1 puff Inhalation BID  . nicotine  21 mg Transdermal Daily  . predniSONE  40 mg Oral Q breakfast   Continuous Infusions: . cefTRIAXone (ROCEPHIN)  IV Stopped (03/29/18 1213)     LOS: 1 day    Time spent: over 30  min    Lacretia Nicks, MD [facility] Pager [phone]   If 7PM-7AM, please contact night-coverage [email or web address] Password [facility] 03/29/2018, 6:46 PM

## 2018-03-29 NOTE — ED Notes (Signed)
Pt to echo.

## 2018-03-30 DIAGNOSIS — J9601 Acute respiratory failure with hypoxia: Secondary | ICD-10-CM

## 2018-03-30 LAB — COMPREHENSIVE METABOLIC PANEL
ALT: 23 U/L (ref 0–44)
AST: 18 U/L (ref 15–41)
Albumin: 2.9 g/dL — ABNORMAL LOW (ref 3.5–5.0)
Alkaline Phosphatase: 50 U/L (ref 38–126)
Anion gap: 8 (ref 5–15)
BUN: 9 mg/dL (ref 6–20)
CHLORIDE: 101 mmol/L (ref 98–111)
CO2: 33 mmol/L — ABNORMAL HIGH (ref 22–32)
CREATININE: 0.65 mg/dL (ref 0.44–1.00)
Calcium: 8.4 mg/dL — ABNORMAL LOW (ref 8.9–10.3)
GFR calc Af Amer: >60 mL/min (ref >60)
GFR calc non Af Amer: >60 mL/min (ref >60)
Glucose, Bld: 129 mg/dL — ABNORMAL HIGH (ref 70–99)
Potassium: 4.3 mmol/L (ref 3.5–5.1)
Sodium: 142 mmol/L (ref 135–145)
Total Bilirubin: 0.5 mg/dL (ref 0.3–1.2)
Total Protein: 6.1 g/dL — ABNORMAL LOW (ref 6.5–8.1)

## 2018-03-30 LAB — CBC
HCT: 53.5 % — ABNORMAL HIGH (ref 36.0–46.0)
Hemoglobin: 16.4 g/dL — ABNORMAL HIGH (ref 12.0–15.0)
MCH: 32.2 pg (ref 26.0–34.0)
MCHC: 30.7 g/dL (ref 30.0–36.0)
MCV: 104.9 fL — ABNORMAL HIGH (ref 80.0–100.0)
Platelets: 216 10*3/uL (ref 150–400)
RBC: 5.1 MIL/uL (ref 3.87–5.11)
RDW: 14.1 % (ref 11.5–15.5)
WBC: 11.3 10*3/uL — ABNORMAL HIGH (ref 4.0–10.5)
nRBC: 0 % (ref 0.0–0.2)

## 2018-03-30 LAB — MAGNESIUM: Magnesium: 2.2 mg/dL (ref 1.7–2.4)

## 2018-03-30 MED ORDER — LORATADINE 10 MG PO TABS
10.0000 mg | ORAL_TABLET | Freq: Every day | ORAL | Status: DC
  Administered 2018-03-30 – 2018-04-01 (×3): 10 mg via ORAL
  Filled 2018-03-30 (×3): qty 1

## 2018-03-30 MED ORDER — FLUTICASONE PROPIONATE 50 MCG/ACT NA SUSP
2.0000 | Freq: Every day | NASAL | Status: DC
  Administered 2018-03-30 – 2018-03-31 (×2): 2 via NASAL
  Filled 2018-03-30: qty 16

## 2018-03-30 MED ORDER — METHYLPREDNISOLONE SODIUM SUCC 40 MG IJ SOLR
40.0000 mg | Freq: Three times a day (TID) | INTRAMUSCULAR | Status: DC
  Administered 2018-03-30 – 2018-04-01 (×7): 40 mg via INTRAVENOUS
  Filled 2018-03-30 (×6): qty 1

## 2018-03-30 MED ORDER — IPRATROPIUM-ALBUTEROL 0.5-2.5 (3) MG/3ML IN SOLN
3.0000 mL | Freq: Four times a day (QID) | RESPIRATORY_TRACT | Status: DC
  Administered 2018-03-30 – 2018-04-01 (×6): 3 mL via RESPIRATORY_TRACT
  Filled 2018-03-30 (×6): qty 3

## 2018-03-30 MED ORDER — BUDESONIDE 0.25 MG/2ML IN SUSP
0.2500 mg | Freq: Two times a day (BID) | RESPIRATORY_TRACT | Status: DC
  Administered 2018-03-30 – 2018-04-01 (×5): 0.25 mg via RESPIRATORY_TRACT
  Filled 2018-03-30 (×5): qty 2

## 2018-03-30 MED ORDER — GUAIFENESIN ER 600 MG PO TB12
600.0000 mg | ORAL_TABLET | Freq: Two times a day (BID) | ORAL | Status: DC
  Administered 2018-03-30 – 2018-04-01 (×5): 600 mg via ORAL
  Filled 2018-03-30 (×5): qty 1

## 2018-03-30 NOTE — Evaluation (Addendum)
Occupational Therapy Evaluation Patient Details Name: Evelyn Peterson MRN: [id number] DOB: [birth year] Today's Date: 03/30/2018    History of Present Illness Evelyn Peterson is a 58 y.o. female with medical history significant of smoking.  Patient presents to the ED with c/o BLE swelling and RLE rash.  She has been having intermittent swelling to BLE for the past 1 month or so.  Yesterday got much worse with development of rash to anterior RLE.   Clinical Impression   Pt admitted with above diagnoses, with cardiopulmonary affecting BADL/IADL independence at this time. Pt is asymptomatic of sats dropping, stated she had no idea her breathing was compromised. Educated pt on ECS strategies to implement in BADL/IADL activity. 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 Pt stood at sink to comb hair, instructed pt to complete pursed lip breathing throughout activity and use ECS strategy. Pt in understanding. Once returned to bed, O2 sats at 85 on RA, improved to 91 on RA with pt very focused on breathing, then back to 87. Returned O2 of 2L [place] at this time, informed RN of pt respiratory status. No further OT needed at this time- OT will sign off, no OT follow up recommended.    Follow Up Recommendations  No OT follow up    Equipment Recommendations  None recommended by OT    Recommendations for Other Services       Precautions / Restrictions Precautions Precautions: None Precaution Comments: Watch O2 Restrictions Weight Bearing Restrictions: No      Mobility Bed Mobility Overal bed mobility: Independent                Transfers Overall transfer level: Independent                    Balance Overall balance assessment: Independent                                         ADL either performed or assessed with clinical judgement   ADL Overall ADL's : Needs assistance/impaired Eating/Feeding:  Independent   Grooming: Modified independent;Standing Grooming Details (indicate cue type and reason): at sink to brush hair. cues to utilize ECS strategies, pt is asymptomatic of sats dropping Upper Body Bathing: Independent   Lower Body Bathing: Modified independent   Upper Body Dressing : Independent Upper Body Dressing Details (indicate cue type and reason): donned robe while standing Lower Body Dressing: Modified independent   Toilet Transfer: Supervision/safety   Toileting- Clothing Manipulation and Hygiene: Modified independent   Tub/ Shower Transfer: Supervision/safety   Functional mobility during ADLs: Supervision/safety General ADL Comments: Pt is completing BADL at baseline level, but needing cues to implement ECS strategies. Once educated, pt proficient in implementation of ECS. Pt is asymptomatic of sats dropping     Vision Baseline Vision/History: No visual deficits Patient Visual Report: No change from baseline       Perception     Praxis      Pertinent Vitals/Pain Pain Assessment: No/denies pain     Hand Dominance Right   Extremity/Trunk Assessment Upper Extremity Assessment Upper Extremity Assessment: Overall WFL for tasks assessed   Lower Extremity Assessment Lower Extremity Assessment: Overall WFL for tasks assessed       Communication Communication Communication: No difficulties   Cognition Arousal/Alertness: Awake/alert Behavior During Therapy: WFL for tasks assessed/performed Overall  Cognitive Status: Within Functional Limits for tasks assessed                                     General Comments       Exercises     Shoulder Instructions      Home Living Family/patient expects to be discharged to:: Private residence Living Arrangements: Other (Comment)(brother and sister in law) Available Help at Discharge: Family;Available 24 hours/day Type of Home: House Home Access: Stairs to enter   Entrance Stairs-Rails:  Can reach both Home Layout: Two level(has stair lift)     Bathroom Shower/Tub: Chief Strategy OfficerTub/shower unit   Bathroom Toilet: Standard     Home Equipment: Grab bars - tub/shower;Bedside commode;Toilet riser   Additional Comments: reports having equipment from elderly family member      Prior Functioning/Environment Level of Independence: Independent        Comments: works as a Child psychotherapistwaitress, completing all BADL/IADL independently         OT Problem List: Decreased activity tolerance;Decreased knowledge of use of DME or AE;Cardiopulmonary status limiting activity;Obesity      OT Treatment/Interventions:      OT Goals(Current goals can be found in the care plan section) Acute Rehab OT Goals Patient Stated Goal: to go home OT Goal Formulation: With patient Time For Goal Achievement: 04/13/18 Potential to Achieve Goals: Good  OT Frequency:     Barriers to D/C:            Co-evaluation              AM-PAC OT "6 Clicks" Daily Activity     Outcome Measure Help from another person eating meals?: None Help from another person taking care of personal grooming?: None Help from another person toileting, which includes using toliet, bedpan, or urinal?: None Help from another person bathing (including washing, rinsing, drying)?: None Help from another person to put on and taking off regular upper body clothing?: None Help from another person to put on and taking off regular lower body clothing?: None 6 Click Score: 24   End of Session Equipment Utilized During Treatment: Oxygen Nurse Communication: Mobility status;Other (comment)(of O2 sats during session)  Activity Tolerance: Patient tolerated treatment well Patient left: in bed;with call bell/phone within reach  OT Visit Diagnosis: Other (comment)(cardiopulmonary status)                Time: [id number] OT Time Calculation (min): 21 min Charges:  OT General Charges $OT Visit: 1 Visit OT Evaluation $OT Eval Low Complexity: 1  Low OT Treatments $Self Care/Home Management : 8-22 mins  Evelyn Peterson, MSOT, OTR/L Behavioral Health OT/ Acute Relief OT [facility] Office: [phone]   Evelyn Peterson 03/30/2018, 3:09 PM

## 2018-03-30 NOTE — Progress Notes (Signed)
PROGRESS NOTE        PATIENT DETAILS Name: Evelyn Peterson Age: 58 y.o. Sex: female Date of Birth: 01/25/1961 Admit Date: 03/28/2018 Admitting Physician Hillary Bow, DO DQQ:[id number], No Pcp Per  Brief Narrative: Patient is a 58 y.o. female with longstanding history of tobacco use (> 40 pack years) presented to the hospital with right lower extremity rash, shortness of breath-and subsequently admitted for COPD exacerbation.  See below for details  Subjective: Still wheezing-although improved not at baseline.  Assessment/Plan: Acute hypoxic respiratory failure secondary to COPD exacerbation: Improving-still wheezing and not yet back to baseline.  Continue steroids, bronchodilators-encourage incentive spirometry/flutter valve.  Attempt to titrate off oxygen-need to assess for home O2 requirement on discharge.  Stop Rocephin--do not see any evidence of pneumonia.  Right lower extremity rash: Improved (see picture below)-not sure what the exact etiology is-not sure if this is improved as the patient was started on steroids.  Needs outpatient dermatology referral.  Tobacco abuse: Counseled-has a 40+ year history of smoking-although does not have a formal diagnosis of COPD-high suspicion given clinical scenario.  Needs formal PFTs.  DVT Prophylaxis: Prophylactic Lovenox   Code Status: Full code  Family Communication: None at bedside  Disposition Plan: Remain inpatient  Antimicrobial agents: Anti-infectives (From admission, onward)   Start     Dose/Rate Route Frequency Ordered Stop   03/29/18 0000  cefTRIAXone (ROCEPHIN) 2 g in sodium chloride 0.9 % 100 mL IVPB     2 g 200 mL/hr over 30 Minutes Intravenous Every 24 hours 03/28/18 2332 04/02/18 2359   03/28/18 2200  ceFAZolin (ANCEF) IVPB 1 g/50 mL premix  Status:  Discontinued     1 g 100 mL/hr over 30 Minutes Intravenous Every 8 hours 03/28/18 1856 03/28/18 2146   03/28/18 2200  ceFAZolin (ANCEF) IVPB 1  g/50 mL premix     1 g 100 mL/hr over 30 Minutes Intravenous  Once 03/28/18 2146 03/28/18 2242      Procedures: None  CONSULTS:  None  Time spent: 25- minutes-Greater than 50% of this time was spent in counseling, explanation of diagnosis, planning of further management, and coordination of care.  MEDICATIONS: Scheduled Meds: . budesonide (PULMICORT) nebulizer solution  0.25 mg Nebulization BID  . enoxaparin (LOVENOX) injection  40 mg Subcutaneous Q24H  . fluticasone  2 spray Each Nare Daily  . guaiFENesin  600 mg Oral BID  . ipratropium-albuterol  3 mL Nebulization Q6H  . loratadine  10 mg Oral Daily  . methylPREDNISolone (SOLU-MEDROL) injection  40 mg Intravenous Q8H  . nicotine  21 mg Transdermal Daily   Continuous Infusions: . cefTRIAXone (ROCEPHIN)  IV 2 g (03/30/18 0016)   PRN Meds:.albuterol   PHYSICAL EXAM: Vital signs: Vitals:   03/30/18 0548 03/30/18 0802 03/30/18 0808 03/30/18 1008  BP: 125/76     Pulse: 73 87  82  Resp: 18 18  18   Temp: 97.6 F (36.4 C)     TempSrc: Oral     SpO2: 94% 93% 93% 92%  Weight: 116.5 kg     Height:       Filed Weights   03/29/18 1345 03/30/18 0548  Weight: 118.4 kg 116.5 kg   Body mass index is 39.05 kg/m.   General appearance :Awake, alert, not in any distress.  HEENT: Atraumatic and Normocephalic Neck: supple Resp:Good air entry bilaterally, coarse  wheezing all over CVS: S1 S2 regular, no murmurs.  GI: Bowel sounds present, Non tender and not distended with no gaurding, rigidity or rebound.No organomegaly Extremities: B/L Lower Ext shows no edema, both legs are warm to touch Neurology:  speech clear,Non focal, sensation is grossly intact. Musculoskeletal:No digital cyanosis Skin:No Rash, warm and dry Wounds:N/A  Media Information   Document Information   Photos    03/30/2018 09:14  Attached To:  Hospital Encounter on 03/28/18  Source Information   Lakeem Rozo, Werner LeanShanker M, MD  [facility]      I have personally reviewed following labs and imaging studies  LABORATORY DATA: CBC: Recent Labs  Lab 03/28/18 1529 03/30/18 0329  WBC 9.8 11.3*  HGB 17.5* 16.4*  HCT 56.5* 53.5*  MCV 104.1* 104.9*  PLT 258 216    Basic Metabolic Panel: Recent Labs  Lab 03/28/18 1529 03/30/18 0329  NA 142 142  K 4.1 4.3  CL 96* 101  CO2 37* 33*  GLUCOSE 109* 129*  BUN 10 9  CREATININE 0.69 0.65  CALCIUM 8.9 8.4*  MG  --  2.2    GFR: Estimated Creatinine Clearance: 101.5 mL/min (by C-G formula based on SCr of 0.65 mg/dL).  Liver Function Tests: Recent Labs  Lab 03/29/18 0203 03/30/18 0329  AST 26 18  ALT 29 23  ALKPHOS 53 50  BILITOT 0.9 0.5  PROT 6.1* 6.1*  ALBUMIN 3.0* 2.9*   No results for input(s): LIPASE, AMYLASE in the last 168 hours. No results for input(s): AMMONIA in the last 168 hours.  Coagulation Profile: No results for input(s): INR, PROTIME in the last 168 hours.  Cardiac Enzymes: No results for input(s): CKTOTAL, CKMB, CKMBINDEX, TROPONINI in the last 168 hours.  BNP (last 3 results) No results for input(s): PROBNP in the last 8760 hours.  HbA1C: No results for input(s): HGBA1C in the last 72 hours.  CBG: No results for input(s): GLUCAP in the last 168 hours.  Lipid Profile: No results for input(s): CHOL, HDL, LDLCALC, TRIG, CHOLHDL, LDLDIRECT in the last 72 hours.  Thyroid Function Tests: No results for input(s): TSH, T4TOTAL, FREET4, T3FREE, THYROIDAB in the last 72 hours.  Anemia Panel: No results for input(s): VITAMINB12, FOLATE, FERRITIN, TIBC, IRON, RETICCTPCT in the last 72 hours.  Urine analysis:    Component Value Date/Time   COLORURINE YELLOW 03/29/2018 0514   APPEARANCEUR CLEAR 03/29/2018 0514   LABSPEC 1.011 03/29/2018 0514   PHURINE 7.0 03/29/2018 0514   GLUCOSEU NEGATIVE 03/29/2018 0514   HGBUR NEGATIVE 03/29/2018 0514   BILIRUBINUR NEGATIVE 03/29/2018 0514   KETONESUR NEGATIVE 03/29/2018 0514   PROTEINUR NEGATIVE  03/29/2018 0514   NITRITE NEGATIVE 03/29/2018 0514   LEUKOCYTESUR NEGATIVE 03/29/2018 0514    Sepsis Labs: Lactic Acid, Venous No results found for: LATICACIDVEN  MICROBIOLOGY: Recent Results (from the past 240 hour(s))  Respiratory Panel by PCR     Status: None   Collection Time: 03/28/18  8:16 PM  Result Value Ref Range Status   Adenovirus NOT DETECTED NOT DETECTED Final   Coronavirus 229E NOT DETECTED NOT DETECTED Final   Coronavirus HKU1 NOT DETECTED NOT DETECTED Final   Coronavirus NL63 NOT DETECTED NOT DETECTED Final   Coronavirus OC43 NOT DETECTED NOT DETECTED Final   Metapneumovirus NOT DETECTED NOT DETECTED Final   Rhinovirus / Enterovirus NOT DETECTED NOT DETECTED Final   Influenza A NOT DETECTED NOT DETECTED Final   Influenza B NOT DETECTED NOT DETECTED Final   Parainfluenza Virus 1 NOT  DETECTED NOT DETECTED Final   Parainfluenza Virus 2 NOT DETECTED NOT DETECTED Final   Parainfluenza Virus 3 NOT DETECTED NOT DETECTED Final   Parainfluenza Virus 4 NOT DETECTED NOT DETECTED Final   Respiratory Syncytial Virus NOT DETECTED NOT DETECTED Final   Bordetella pertussis NOT DETECTED NOT DETECTED Final   Chlamydophila pneumoniae NOT DETECTED NOT DETECTED Final   Mycoplasma pneumoniae NOT DETECTED NOT DETECTED Final    Comment: Performed at [facility], [address] [place], [place] [address]  Blood culture (routine x 2)     Status: None (Preliminary result)   Collection Time: 03/28/18  8:40 PM  Result Value Ref Range Status   Specimen Description BLOOD RIGHT HAND  Final   Special Requests   Final    BOTTLES DRAWN AEROBIC AND ANAEROBIC Blood Culture adequate volume   Culture   Final    NO GROWTH < 24 HOURS Performed at [facility], [address] [place], [place] [address]    Report Status PENDING  Incomplete  Blood culture (routine x 2)     Status: None (Preliminary result)   Collection Time: 03/28/18  9:05 PM  Result Value Ref Range Status   Specimen  Description BLOOD LEFT ANTECUBITAL  Final   Special Requests   Final    BOTTLES DRAWN AEROBIC AND ANAEROBIC Blood Culture adequate volume   Culture   Final    NO GROWTH < 24 HOURS Performed at [facility], [address] [place], [place] [address]    Report Status PENDING  Incomplete    RADIOLOGY STUDIES/RESULTS: Dg Chest 2 View  Result Date: 03/28/2018 CLINICAL DATA:  Shortness of breath EXAM: CHEST - 2 VIEW COMPARISON:  08/26/2014 FINDINGS: Borderline heart size. Negative aortic and hilar contours. Large lung volumes on the lateral view. There is no edema, consolidation, effusion, or pneumothorax. IMPRESSION: No evidence of acute disease. Electronically Signed   By: Marnee Spring M.D.   On: 03/28/2018 16:17   Vas [place] Lower Extremity Venous (dvt) (only [facility])  Result Date: 03/29/2018  Lower Venous Study Indications: Swelling.  Performing Technologist: Gertie Fey MHA, RDMS, RVT, RDCS  Examination Guidelines: A complete evaluation includes B-mode imaging, spectral Doppler, color Doppler, and power Doppler as needed of all accessible portions of each vessel. Bilateral testing is considered an integral part of a complete examination. Limited examinations for reoccurring indications may be performed as noted.  Right Venous Findings: +---------+---------------+---------+-----------+----------+--------------+          CompressibilityPhasicitySpontaneityPropertiesSummary        +---------+---------------+---------+-----------+----------+--------------+ CFV      Full           No       Yes                  Pulsatile flow +---------+---------------+---------+-----------+----------+--------------+ SFJ      Full                                                        +---------+---------------+---------+-----------+----------+--------------+ FV Prox  Full                                                         +---------+---------------+---------+-----------+----------+--------------+  FV Mid   Full                                                        +---------+---------------+---------+-----------+----------+--------------+ FV DistalFull                                                        +---------+---------------+---------+-----------+----------+--------------+ PFV      Full                                                        +---------+---------------+---------+-----------+----------+--------------+ POP      Full           No       Yes                  Pulsatile flow +---------+---------------+---------+-----------+----------+--------------+ PTV      Full                                                        +---------+---------------+---------+-----------+----------+--------------+ PERO     Full                                                        +---------+---------------+---------+-----------+----------+--------------+  Left Venous Findings: +---+---------------+---------+-----------+----------+-------+    CompressibilityPhasicitySpontaneityPropertiesSummary +---+---------------+---------+-----------+----------+-------+ CFVFull           Yes      Yes                          +---+---------------+---------+-----------+----------+-------+    Summary: Right: There is no evidence of deep vein thrombosis in the lower extremity. No cystic structure found in the popliteal fossa. Left: No evidence of common femoral vein obstruction.  *See table(s) above for measurements and observations. Electronically signed by Lemar Livings MD on 03/29/2018 at 11:16:55 AM.    Final      LOS: 2 days   Jeoffrey Massed, MD  [facility]  If 7PM-7AM, please contact night-coverage  Please page via [email or web address]-Password [email or web address]-click on MD name and type text message  03/30/2018, 1:47 PM

## 2018-03-30 NOTE — Progress Notes (Signed)
Nutrition Consult/Brief Note  RD consulted via Inpatient COPD Exacerbation protocol.  Wt Readings from Last 15 Encounters:  03/30/18 116.5 kg  08/26/12 120.2 kg   Body mass index is 39.05 kg/m. Patient meets criteria for Obesity Class II based on current BMI.   Current diet order is Heart Healthy, patient is consuming approximately 100% of meals at this time. Labs and medications reviewed.   No nutrition interventions warranted at this time. If nutrition issues arise, please consult RD.   Maureen Chatters, RD, LDN Pager #: [phone] After-Hours Pager #: [phone]

## 2018-03-31 ENCOUNTER — Other Ambulatory Visit: Payer: Self-pay

## 2018-03-31 ENCOUNTER — Inpatient Hospital Stay (HOSPITAL_COMMUNITY): Payer: Self-pay

## 2018-03-31 ENCOUNTER — Encounter (HOSPITAL_COMMUNITY): Payer: Self-pay | Admitting: General Practice

## 2018-03-31 MED ORDER — PNEUMOCOCCAL VAC POLYVALENT 25 MCG/0.5ML IJ INJ
0.5000 mL | INJECTION | INTRAMUSCULAR | Status: AC
  Administered 2018-04-01: 0.5 mL via INTRAMUSCULAR
  Filled 2018-03-31: qty 0.5

## 2018-03-31 MED ORDER — TIOTROPIUM BROMIDE MONOHYDRATE 18 MCG IN CAPS: 18.0000 ug | ORAL_CAPSULE | Freq: Every day | RESPIRATORY_TRACT | 0 refills | Status: DC

## 2018-03-31 MED ORDER — PREDNISONE 10 MG PO TABS: ORAL_TABLET | ORAL | 0 refills | Status: DC

## 2018-03-31 MED ORDER — IOPAMIDOL (ISOVUE-370) INJECTION 76%
100.0000 mL | Freq: Once | INTRAVENOUS | Status: AC | PRN
  Administered 2018-03-31: 100 mL via INTRAVENOUS

## 2018-03-31 MED ORDER — IOPAMIDOL (ISOVUE-370) INJECTION 76%
INTRAVENOUS | Status: AC
  Filled 2018-03-31: qty 100

## 2018-03-31 MED ORDER — INFLUENZA VAC SPLIT QUAD 0.5 ML IM SUSY
0.5000 mL | PREFILLED_SYRINGE | INTRAMUSCULAR | Status: AC
  Administered 2018-04-01: 0.5 mL via INTRAMUSCULAR
  Filled 2018-03-31: qty 0.5

## 2018-03-31 MED ORDER — AMOXICILLIN-POT CLAVULANATE 875-125 MG PO TABS
1.0000 | ORAL_TABLET | Freq: Two times a day (BID) | ORAL | Status: DC
  Administered 2018-03-31 – 2018-04-01 (×2): 1 via ORAL
  Filled 2018-03-31 (×2): qty 1

## 2018-03-31 MED ORDER — GUAIFENESIN ER 600 MG PO TB12: 600.0000 mg | ORAL_TABLET | Freq: Two times a day (BID) | ORAL | 0 refills | Status: DC

## 2018-03-31 MED ORDER — BUDESONIDE-FORMOTEROL FUMARATE 160-4.5 MCG/ACT IN AERO: 2.0000 | INHALATION_SPRAY | Freq: Two times a day (BID) | RESPIRATORY_TRACT | 12 refills | Status: DC

## 2018-03-31 MED ORDER — NICOTINE 21 MG/24HR TD PT24: 21.0000 mg | MEDICATED_PATCH | Freq: Every day | TRANSDERMAL | 0 refills | Status: DC

## 2018-03-31 MED ORDER — ALBUTEROL SULFATE HFA 108 (90 BASE) MCG/ACT IN AERS: 2.0000 | INHALATION_SPRAY | Freq: Four times a day (QID) | RESPIRATORY_TRACT | 0 refills | Status: DC | PRN

## 2018-03-31 MED ORDER — FLUTICASONE-SALMETEROL 250-50 MCG/DOSE IN AEPB: 1.0000 | INHALATION_SPRAY | Freq: Two times a day (BID) | RESPIRATORY_TRACT | 0 refills | Status: DC

## 2018-03-31 MED FILL — predniSONE 10 MG TABS: 10 | 7 days supply | Qty: 19 | Fill #0

## 2018-03-31 MED FILL — SYMBICORT 160-4.5 MCG INH: 160-4.5 | 30 days supply | Qty: 10 | Fill #0

## 2018-03-31 MED FILL — SPIRIVA 18 MCG CP-HANDIHALE: 18 | 30 days supply | Qty: 30 | Fill #0

## 2018-03-31 NOTE — Care Management Note (Addendum)
Case Management Note  Patient Details  Name: Evelyn Peterson MRN: [id number] Date of Birth: Aug 28, 1960  Subjective/Objective:  Pt presented for COPD Exacerbation. 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 Pt is without insurance and PCP at this time.                   Action/Plan: CM will call to schedule Hospital Follow up appointment at the [facility] and patient will be able to utilize the [facility] for Medication Assistance-cost ranging from $4.00-$10.00. DME 02 needed and referral called to [facility]. [facility] via [facility] to see if she qualifies. TOC pharmacy to deliver medications to bedside today. No further needs from CM at this time.   Expected Discharge Date:                  Expected Discharge Plan:  Home/Self Care  In-House Referral:  PCP / [facility]  Discharge planning Services  CM Consult, Follow-up appt scheduled, [facility], Medication Assistance(Medications to be delivered via bedside. ) MATCH  Post Acute Care Choice:  Durable Medical Equipment Choice offered to:  Patient  DME Arranged:  Oxygen DME Agency:  [facility]  HH Arranged:  NA HH Agency:  NA  Status of Service:  Completed, signed off  If discussed at Long Length of Stay Meetings, dates discussed:    Additional Comments: 1426 03-31-18 Tomi Bamberger, RN,BSN [phone] MATCH utilized for Medications and Hospital follow up scheduled and placed on AVS. Medications will be delivered to bedside from [facility]. No further needs from CM at this time.  Gala Lewandowsky, RN 03/31/2018, 1:23 PM

## 2018-03-31 NOTE — Progress Notes (Signed)
SATURATION QUALIFICATIONS: (This note is used to comply with regulatory documentation for home oxygen)  Patient Saturations on Room Air at Rest = 87%  Patient Saturations on Room Air while Ambulating = 89-90%  Patient Saturations on 2 Liters of oxygen while Ambulating = > 90%  Please briefly explain why patient needs home oxygen: 87% on room air while resting

## 2018-03-31 NOTE — Progress Notes (Signed)
PROGRESS NOTE        PATIENT DETAILS Name: Evelyn Peterson Age: 58 y.o. Sex: female Date of Birth: 08-24-1960 Admit Date: 03/28/2018 Admitting Physician Hillary BowJared M Gardner, DO JXB:[id number]:Patient, No Pcp Per  Brief Narrative: Patient is a 58 y.o. female with longstanding history of tobacco use (> 40 pack years) presented to the hospital with right lower extremity rash, shortness of breath-and subsequently admitted for COPD exacerbation.  See below for details  Subjective: Improved-minimal wheezing today-still hypoxic when ambulating-dropped to the mid 70s when I walked her in the hallway this morning.  Assessment/Plan: Acute hypoxic respiratory failure secondary to COPD exacerbation: Overall improved-not yet back to baseline-continue Solu-Medrol but start tapering, continue bronchodilators.  Slowly improving-suspect needs 1 additional day of hospitalization-probably will require home O2 on discharge.  Will check CT angiogram-to rule out PE because of persistent hypoxia (seems disproportionate) despite significant improvement in her wheezing.  Right lower extremity rash: Significantly improved-see picture taken on 1/2.  Not sure if improvement is due to steroids that she is on for COPD.  If rash persists over the next few weeks-patient aware that she probably needs outpatient dermatology referral.   Tobacco abuse: Counseled-has a 40+ year history of smoking-although does not have a formal diagnosis of COPD-high suspicion given clinical scenario.  Needs formal PFTs.  DVT Prophylaxis: Prophylactic Lovenox   Code Status: Full code  Family Communication: None at bedside  Disposition Plan: Remain inpatient  Antimicrobial agents: Anti-infectives (From admission, onward)   Start     Dose/Rate Route Frequency Ordered Stop   03/29/18 0000  cefTRIAXone (ROCEPHIN) 2 g in sodium chloride 0.9 % 100 mL IVPB  Status:  Discontinued     2 g 200 mL/hr over 30 Minutes Intravenous  Every 24 hours 03/28/18 2332 03/30/18 1359   03/28/18 2200  ceFAZolin (ANCEF) IVPB 1 g/50 mL premix  Status:  Discontinued     1 g 100 mL/hr over 30 Minutes Intravenous Every 8 hours 03/28/18 1856 03/28/18 2146   03/28/18 2200  ceFAZolin (ANCEF) IVPB 1 g/50 mL premix     1 g 100 mL/hr over 30 Minutes Intravenous  Once 03/28/18 2146 03/28/18 2242      Procedures: None  CONSULTS:  None  Time spent: 25- minutes-Greater than 50% of this time was spent in counseling, explanation of diagnosis, planning of further management, and coordination of care.  MEDICATIONS: Scheduled Meds: . budesonide (PULMICORT) nebulizer solution  0.25 mg Nebulization BID  . enoxaparin (LOVENOX) injection  40 mg Subcutaneous Q24H  . fluticasone  2 spray Each Nare Daily  . guaiFENesin  600 mg Oral BID  . [START ON 04/01/2018] Influenza vac split quadrivalent PF  0.5 mL Intramuscular Tomorrow-1000  . iopamidol      . ipratropium-albuterol  3 mL Nebulization Q6H  . loratadine  10 mg Oral Daily  . methylPREDNISolone (SOLU-MEDROL) injection  40 mg Intravenous Q8H  . nicotine  21 mg Transdermal Daily  . [START ON 04/01/2018] pneumococcal 23 valent vaccine  0.5 mL Intramuscular Tomorrow-1000   Continuous Infusions:  PRN Meds:.albuterol   PHYSICAL EXAM: Vital signs: Vitals:   03/30/18 2102 03/30/18 2103 03/31/18 0506 03/31/18 0902  BP: 140/68 140/68 132/63   Pulse: 81 69 71   Resp:      Temp: 98.1 F (36.7 C) 98.1 F (36.7 C) (!) 97.5 F (36.4 C)  TempSrc: Oral Oral Oral   SpO2: (!) 86% (!) 88% 91% 90%  Weight:   116 kg   Height:       Filed Weights   03/29/18 1345 03/30/18 0548 03/31/18 0506  Weight: 118.4 kg 116.5 kg 116 kg   Body mass index is 38.89 kg/m.   General appearance:Awake, alert, not in any distress.  Eyes:no scleral icterus. HEENT: Atraumatic and Normocephalic Neck: supple, no JVD. Resp:Good air entry bilaterally, some scattered wheezing CVS: S1 S2 regular, no murmurs.  GI:  Bowel sounds present, Non tender and not distended with no gaurding, rigidity or rebound. Extremities: B/L Lower Ext shows no edema, both legs are warm to touch Neurology:  Non focal Psychiatric: Normal judgment and insight. Normal mood. Musculoskeletal:No digital cyanosis Wounds:N/A  I have personally reviewed following labs and imaging studies  LABORATORY DATA: CBC: Recent Labs  Lab 03/28/18 1529 03/30/18 0329  WBC 9.8 11.3*  HGB 17.5* 16.4*  HCT 56.5* 53.5*  MCV 104.1* 104.9*  PLT 258 216    Basic Metabolic Panel: Recent Labs  Lab 03/28/18 1529 03/30/18 0329  NA 142 142  K 4.1 4.3  CL 96* 101  CO2 37* 33*  GLUCOSE 109* 129*  BUN 10 9  CREATININE 0.69 0.65  CALCIUM 8.9 8.4*  MG  --  2.2    GFR: Estimated Creatinine Clearance: 101.2 mL/min (by C-G formula based on SCr of 0.65 mg/dL).  Liver Function Tests: Recent Labs  Lab 03/29/18 0203 03/30/18 0329  AST 26 18  ALT 29 23  ALKPHOS 53 50  BILITOT 0.9 0.5  PROT 6.1* 6.1*  ALBUMIN 3.0* 2.9*   No results for input(s): LIPASE, AMYLASE in the last 168 hours. No results for input(s): AMMONIA in the last 168 hours.  Coagulation Profile: No results for input(s): INR, PROTIME in the last 168 hours.  Cardiac Enzymes: No results for input(s): CKTOTAL, CKMB, CKMBINDEX, TROPONINI in the last 168 hours.  BNP (last 3 results) No results for input(s): PROBNP in the last 8760 hours.  HbA1C: No results for input(s): HGBA1C in the last 72 hours.  CBG: No results for input(s): GLUCAP in the last 168 hours.  Lipid Profile: No results for input(s): CHOL, HDL, LDLCALC, TRIG, CHOLHDL, LDLDIRECT in the last 72 hours.  Thyroid Function Tests: No results for input(s): TSH, T4TOTAL, FREET4, T3FREE, THYROIDAB in the last 72 hours.  Anemia Panel: No results for input(s): VITAMINB12, FOLATE, FERRITIN, TIBC, IRON, RETICCTPCT in the last 72 hours.  Urine analysis:    Component Value Date/Time   COLORURINE YELLOW  03/29/2018 0514   APPEARANCEUR CLEAR 03/29/2018 0514   LABSPEC 1.011 03/29/2018 0514   PHURINE 7.0 03/29/2018 0514   GLUCOSEU NEGATIVE 03/29/2018 0514   HGBUR NEGATIVE 03/29/2018 0514   BILIRUBINUR NEGATIVE 03/29/2018 0514   KETONESUR NEGATIVE 03/29/2018 0514   PROTEINUR NEGATIVE 03/29/2018 0514   NITRITE NEGATIVE 03/29/2018 0514   LEUKOCYTESUR NEGATIVE 03/29/2018 0514    Sepsis Labs: Lactic Acid, Venous No results found for: LATICACIDVEN  MICROBIOLOGY: Recent Results (from the past 240 hour(s))  Respiratory Panel by PCR     Status: None   Collection Time: 03/28/18  8:16 PM  Result Value Ref Range Status   Adenovirus NOT DETECTED NOT DETECTED Final   Coronavirus 229E NOT DETECTED NOT DETECTED Final   Coronavirus HKU1 NOT DETECTED NOT DETECTED Final   Coronavirus NL63 NOT DETECTED NOT DETECTED Final   Coronavirus OC43 NOT DETECTED NOT DETECTED Final   Metapneumovirus NOT DETECTED NOT  DETECTED Final   Rhinovirus / Enterovirus NOT DETECTED NOT DETECTED Final   Influenza A NOT DETECTED NOT DETECTED Final   Influenza B NOT DETECTED NOT DETECTED Final   Parainfluenza Virus 1 NOT DETECTED NOT DETECTED Final   Parainfluenza Virus 2 NOT DETECTED NOT DETECTED Final   Parainfluenza Virus 3 NOT DETECTED NOT DETECTED Final   Parainfluenza Virus 4 NOT DETECTED NOT DETECTED Final   Respiratory Syncytial Virus NOT DETECTED NOT DETECTED Final   Bordetella pertussis NOT DETECTED NOT DETECTED Final   Chlamydophila pneumoniae NOT DETECTED NOT DETECTED Final   Mycoplasma pneumoniae NOT DETECTED NOT DETECTED Final    Comment: Performed at [facility], [address], [place], [place] [address]  Blood culture (routine x 2)     Status: None (Preliminary result)   Collection Time: 03/28/18  8:40 PM  Result Value Ref Range Status   Specimen Description BLOOD RIGHT HAND  Final   Special Requests   Final    BOTTLES DRAWN AEROBIC AND ANAEROBIC Blood Culture adequate volume   Culture   Final      NO GROWTH 3 DAYS Performed at [facility], [address] [place], [place] [address]    Report Status PENDING  Incomplete  Blood culture (routine x 2)     Status: None (Preliminary result)   Collection Time: 03/28/18  9:05 PM  Result Value Ref Range Status   Specimen Description BLOOD LEFT ANTECUBITAL  Final   Special Requests   Final    BOTTLES DRAWN AEROBIC AND ANAEROBIC Blood Culture adequate volume   Culture   Final    NO GROWTH 3 DAYS Performed at [facility], [address] [place], [place] [address]    Report Status PENDING  Incomplete    RADIOLOGY STUDIES/RESULTS: Dg Chest 2 View  Result Date: 03/28/2018 CLINICAL DATA:  Shortness of breath EXAM: CHEST - 2 VIEW COMPARISON:  08/26/2014 FINDINGS: Borderline heart size. Negative aortic and hilar contours. Large lung volumes on the lateral view. There is no edema, consolidation, effusion, or pneumothorax. IMPRESSION: No evidence of acute disease. Electronically Signed   By: Marnee Spring M.D.   On: 03/28/2018 16:17   Vas [place] Lower Extremity Venous (dvt) (only [facility])  Result Date: 03/29/2018  Lower Venous Study Indications: Swelling.  Performing Technologist: Gertie Fey MHA, RDMS, RVT, RDCS  Examination Guidelines: A complete evaluation includes B-mode imaging, spectral Doppler, color Doppler, and power Doppler as needed of all accessible portions of each vessel. Bilateral testing is considered an integral part of a complete examination. Limited examinations for reoccurring indications may be performed as noted.  Right Venous Findings: +---------+---------------+---------+-----------+----------+--------------+          CompressibilityPhasicitySpontaneityPropertiesSummary        +---------+---------------+---------+-----------+----------+--------------+ CFV      Full           No       Yes                  Pulsatile flow +---------+---------------+---------+-----------+----------+--------------+  SFJ      Full                                                        +---------+---------------+---------+-----------+----------+--------------+ FV Prox  Full                                                        +---------+---------------+---------+-----------+----------+--------------+  FV Mid   Full                                                        +---------+---------------+---------+-----------+----------+--------------+ FV DistalFull                                                        +---------+---------------+---------+-----------+----------+--------------+ PFV      Full                                                        +---------+---------------+---------+-----------+----------+--------------+ POP      Full           No       Yes                  Pulsatile flow +---------+---------------+---------+-----------+----------+--------------+ PTV      Full                                                        +---------+---------------+---------+-----------+----------+--------------+ PERO     Full                                                        +---------+---------------+---------+-----------+----------+--------------+  Left Venous Findings: +---+---------------+---------+-----------+----------+-------+    CompressibilityPhasicitySpontaneityPropertiesSummary +---+---------------+---------+-----------+----------+-------+ CFVFull           Yes      Yes                          +---+---------------+---------+-----------+----------+-------+    Summary: Right: There is no evidence of deep vein thrombosis in the lower extremity. No cystic structure found in the popliteal fossa. Left: No evidence of common femoral vein obstruction.  *See table(s) above for measurements and observations. Electronically signed by Lemar Livings MD on 03/29/2018 at 11:16:55 AM.    Final      LOS: 3 days   Jeoffrey Massed, MD  [facility]  If 7PM-7AM, please contact night-coverage  Please page via [email or web address] [email or web address]-click on MD name and type text message  03/31/2018, 1:34 PM

## 2018-04-01 DIAGNOSIS — J449 Chronic obstructive pulmonary disease, unspecified: Secondary | ICD-10-CM

## 2018-04-01 MED ORDER — AMOXICILLIN-POT CLAVULANATE 875-125 MG PO TABS: 1.0000 | ORAL_TABLET | Freq: Two times a day (BID) | ORAL | 0 refills | Status: DC

## 2018-04-01 NOTE — Discharge Summary (Addendum)
PATIENT DETAILS Name: Evelyn Peterson Age: 58 y.o. Sex: female Date of Birth: 1960-10-20 MRN: [id number]. Admitting Physician: Hillary Bow, DO NGE:[id number], No Pcp Per  Admit Date: 03/28/2018 Discharge date: 04/01/2018  Recommendations for Outpatient Follow-up:  1. Follow up with PCP in 1-2 weeks 2. Please obtain BMP/CBC in one week 3. Needs outpatient PFTs and pulmonology referral 4. If lower extremity rash does not subside-needs dermatology referral 5. Continue counseling regarding importance of stopping tobacco use   Admitted From:  Home  Disposition: Home   Home Health: No  Equipment/Devices: Oxygen 2L  Discharge Condition: Stable  CODE STATUS: FULL CODE  Diet recommendation:  Regular  Brief Summary: See H&P, Labs, Consult and Test reports for all details in brief, Patient is a 58 y.o. female with longstanding history of tobacco use (> 40 pack years) presented to the hospital with right lower extremity rash, shortness of breath-and subsequently admitted for COPD exacerbation.  See below for details  Brief Hospital Course: Acute hypoxic respiratory failure secondary to COPD exacerbation and community-acquired pneumonia:  Much improved-hardly any wheezing-treated with steroids, bronchodilators and empiric antimicrobial therapy.  Echocardiogram with preserved EF, CT angiogram without PE but showed mucous plugging and possible pneumonia-will be transition to Augmentin for a few more days on discharge.  Patient does not have a formal diagnosis of COPD, however given a long history of smoking and her clinical presentation-it is thought that she probably has undiagnosed COPD.  Once she is back to her baseline-she will require formal PFTs-but this can be done in the outpatient setting.  Even with clinical improvement-she is persistently hypoxic-hence being discharged on home O2.  [facility] for a post hospital  discharge follow-up appointment.  She will be placed on Symbicort, Spiriva, tapering prednisone on discharge.  Extensive counseling regarding importance of tobacco cessation done during this hospital stay.  Right lower extremity rash: Significantly improved-see picture taken on 1/2.  Not sure if improvement is due to steroids that she is on for COPD.  If rash persists over the next few weeks-patient aware that she probably needs outpatient dermatology referral.   Tobacco abuse: Counseled-has a 40+ year history of smoking-although does not have a formal diagnosis of COPD-high suspicion given clinical scenario.  Needs formal PFTs in the outpatient setting.  Procedures/Studies: None  Discharge Diagnoses:  Principal Problem:   COPD with acute exacerbation (HCC) Active Problems:   Peripheral edema   Acute respiratory failure with hypoxia [facility])   Discharge Instructions:  Activity:  As tolerated  Discharge Instructions    Call MD for:  difficulty breathing, headache or visual disturbances   Complete by:  As directed    Diet general   Complete by:  As directed    Discharge instructions   Complete by:  As directed    Follow with Primary MD in 1 week  Stop smoking  Use Oxygen 24/7  Please get a complete blood count and chemistry panel checked by your Primary MD at your next visit, and again as instructed by your Primary MD.  Get Medicines reviewed and adjusted: Please take all your medications with you for your next visit with your Primary MD  Laboratory/radiological data: Please request your Primary MD to go over all hospital tests and procedure/radiological results at the follow up, please ask your Primary MD to get all Hospital records sent to his/her office.  In some cases, they will be blood work, cultures and biopsy results  pending at the time of your discharge. Please request that your primary care M.D. follows up on these results.  Also Note the following: If you  experience worsening of your admission symptoms, develop shortness of breath, life threatening emergency, suicidal or homicidal thoughts you must seek medical attention immediately by calling 911 or calling your MD immediately  if symptoms less severe.  You must read complete instructions/literature along with all the possible adverse reactions/side effects for all the Medicines you take and that have been prescribed to you. Take any new Medicines after you have completely understood and accpet all the possible adverse reactions/side effects.   Do not drive when taking Pain medications or sleeping medications (Benzodaizepines)  Do not take more than prescribed Pain, Sleep and Anxiety Medications. It is not advisable to combine anxiety,sleep and pain medications without talking with your primary care practitioner  Special Instructions: If you have smoked or chewed Tobacco  in the last 2 yrs please stop smoking, stop any regular Alcohol  and or any Recreational drug use.  Wear Seat belts while driving.  Please note: You were cared for by a hospitalist during your hospital stay. Once you are discharged, your primary care physician will handle any further medical issues. Please note that NO REFILLS for any discharge medications will be authorized once you are discharged, as it is imperative that you return to your primary care physician (or establish a relationship with a primary care physician if you do not have one) for your post hospital discharge needs so that they can reassess your need for medications and monitor your lab values.   Increase activity slowly   Complete by:  As directed      Allergies as of 04/01/2018   No Known Allergies     Medication List    STOP taking these medications   naproxen 500 MG tablet Commonly known as:  NAPROSYN     TAKE these medications   albuterol 108 (90 Base) MCG/ACT inhaler Commonly known as:  PROVENTIL HFA;VENTOLIN HFA Inhale 2 puffs into the lungs  every 6 (six) hours as needed for wheezing or shortness of breath.   amoxicillin-clavulanate 875-125 MG tablet Commonly known as:  AUGMENTIN Take 1 tablet by mouth every 12 (twelve) hours.   budesonide-formoterol 160-4.5 MCG/ACT inhaler Commonly known as:  SYMBICORT Inhale 2 puffs into the lungs 2 (two) times daily.   guaiFENesin 600 MG 12 hr tablet Commonly known as:  MUCINEX Take 1 tablet (600 mg total) by mouth 2 (two) times daily.   nicotine 21 mg/24hr patch Commonly known as:  NICODERM CQ - dosed in mg/24 hours Place 1 patch (21 mg total) onto the skin daily.   predniSONE 10 MG tablet Commonly known as:  DELTASONE Take 40 mg daily for 2 days, then, 30 mg daily for 2 days, then,  20 mg daily for 2 days, then, 10 mg daily for 1 day, then stop   tiotropium 18 MCG inhalation capsule Commonly known as:  SPIRIVA HANDIHALER Place 1 capsule (18 mcg total) into inhaler and inhale daily.            Durable Medical Equipment  (From admission, onward)         Start     Ordered   03/31/18 1328  DME Oxygen  Once    Question Answer Comment  Mode or (Route) Nasal cannula   Liters per Minute 2   Frequency Continuous (stationary and portable oxygen unit needed)   Oxygen conserving device  Yes   Oxygen delivery system Gas      03/31/18 1334         Follow-up Information    [facility]. Go to.   Why:  this location for assistance for medicaitons- cost will range from $4.00-$10.00 Contact information: [address] [place] [address] [phone]       [facility] - Dme Follow up.   Why:  Oxygen  Contact information: [address] [place] [address] [phone]        Storm Frisk, MD Follow up on 04/18/2018.   Specialty:  Pulmonary Disease Why:  @ 2:00 pm for Hospital Follow Up Appointment- If you can not make this scheduled appointment please call the office to reschedule.    Contact information: [address] [place] [address] [phone]          No Known Allergies  Consultations:   None  Other Procedures/Studies: Dg Chest 2 View  Result Date: 03/28/2018 CLINICAL DATA:  Shortness of breath EXAM: CHEST - 2 VIEW COMPARISON:  08/26/2014 FINDINGS: Borderline heart size. Negative aortic and hilar contours. Large lung volumes on the lateral view. There is no edema, consolidation, effusion, or pneumothorax. IMPRESSION: No evidence of acute disease. Electronically Signed   By: Marnee Spring M.D.   On: 03/28/2018 16:17   Ct Angio Chest Pe W Or Wo Contrast  Result Date: 03/31/2018 CLINICAL DATA:  Short of breath EXAM: CT ANGIOGRAPHY CHEST WITH CONTRAST TECHNIQUE: Multidetector CT imaging of the chest was performed using the standard protocol during bolus administration of intravenous contrast. Multiplanar CT image reconstructions and MIPs were obtained to evaluate the vascular anatomy. CONTRAST:  ISOVUE-370 IOPAMIDOL (ISOVUE-370) INJECTION 76% COMPARISON:  None. FINDINGS: Cardiovascular: There are no filling defects in the pulmonary arterial tree to suggest acute pulmonary thromboembolism. Atherosclerotic calcifications in the aortic arch and great vessels. Mediastinum/Nodes: No abnormal mediastinal adenopathy. No pericardial effusion. Lungs/Pleura: Dependent atelectasis. There is bronchial wall thickening in the lower lobes. There is occlusion of small peripheral airways in the right lower lobe. Patchy airspace disease towards the posterior right lung base. Upper Abdomen: No acute abnormality. Musculoskeletal: No vertebral compression deformity. Review of the MIP images confirms the above findings. IMPRESSION: No evidence of acute pulmonary thromboembolism. There is bronchial wall thickening in both lower lobes right greater than left. There is occlusion of the small peripheral airways in the right lower lobe with posterior basilar airspace disease in  the right lower lobe. Aortic Atherosclerosis (ICD10-I70.0). Electronically Signed   By: Jolaine Click M.D.   On: 03/31/2018 13:55   Vas [place] Lower Extremity Venous (dvt) (only Mc & Wl)  Result Date: 03/29/2018  Lower Venous Study Indications: Swelling.  Performing Technologist: Gertie Fey MHA, RDMS, RVT, RDCS  Examination Guidelines: A complete evaluation includes B-mode imaging, spectral Doppler, color Doppler, and power Doppler as needed of all accessible portions of each vessel. Bilateral testing is considered an integral part of a complete examination. Limited examinations for reoccurring indications may be performed as noted.  Right Venous Findings: +---------+---------------+---------+-----------+----------+--------------+          CompressibilityPhasicitySpontaneityPropertiesSummary        +---------+---------------+---------+-----------+----------+--------------+ CFV      Full           No       Yes                  Pulsatile flow +---------+---------------+---------+-----------+----------+--------------+ SFJ  Full                                                        +---------+---------------+---------+-----------+----------+--------------+ FV Prox  Full                                                        +---------+---------------+---------+-----------+----------+--------------+ FV Mid   Full                                                        +---------+---------------+---------+-----------+----------+--------------+ FV DistalFull                                                        +---------+---------------+---------+-----------+----------+--------------+ PFV      Full                                                        +---------+---------------+---------+-----------+----------+--------------+ POP      Full           No       Yes                  Pulsatile flow  +---------+---------------+---------+-----------+----------+--------------+ PTV      Full                                                        +---------+---------------+---------+-----------+----------+--------------+ PERO     Full                                                        +---------+---------------+---------+-----------+----------+--------------+  Left Venous Findings: +---+---------------+---------+-----------+----------+-------+    CompressibilityPhasicitySpontaneityPropertiesSummary +---+---------------+---------+-----------+----------+-------+ CFVFull           Yes      Yes                          +---+---------------+---------+-----------+----------+-------+    Summary: Right: There is no evidence of deep vein thrombosis in the lower extremity. No cystic structure found in the popliteal fossa. Left: No evidence of common femoral vein obstruction.  *See table(s) above for measurements and observations. Electronically signed by Lemar Livings MD on 03/29/2018 at 11:16:55 AM.    Final       TODAY-DAY OF DISCHARGE:  Subjective:   Hadessah Nusz today has no headache,no chest  abdominal pain,no new weakness tingling or numbness, feels much better wants to go home today.   Objective:   Blood pressure (!) 152/82, pulse 65, temperature 97.9 F (36.6 C), temperature source Oral, resp. rate 20, height 5\' 8"  (1.727 m), weight 117.3 kg, SpO2 93 %.  Intake/Output Summary (Last 24 hours) at 04/01/2018 0848 Last data filed at 04/01/2018 0641 Gross per 24 hour  Intake 1680 ml  Output -  Net 1680 ml   Filed Weights   03/30/18 0548 03/31/18 0506 04/01/18 0436  Weight: 116.5 kg 116 kg 117.3 kg    Exam: Awake Alert, Oriented *3, No new F.N deficits, Normal affect [place].AT,PERRAL Supple Neck,No JVD, No cervical lymphadenopathy appriciated.  Symmetrical Chest wall movement, Good air movement bilaterally, some few scattered rhonchi RRR,No Gallops,Rubs or new Murmurs, No  Parasternal Heave +ve B.Sounds, Abd Soft, Non tender, No organomegaly appriciated, No rebound -guarding or rigidity. No Cyanosis, Clubbing or edema, No new Rash or bruise   PERTINENT RADIOLOGIC STUDIES: Dg Chest 2 View  Result Date: 03/28/2018 CLINICAL DATA:  Shortness of breath EXAM: CHEST - 2 VIEW COMPARISON:  08/26/2014 FINDINGS: Borderline heart size. Negative aortic and hilar contours. Large lung volumes on the lateral view. There is no edema, consolidation, effusion, or pneumothorax. IMPRESSION: No evidence of acute disease. Electronically Signed   By: Marnee Spring M.D.   On: 03/28/2018 16:17   Ct Angio Chest Pe W Or Wo Contrast  Result Date: 03/31/2018 CLINICAL DATA:  Short of breath EXAM: CT ANGIOGRAPHY CHEST WITH CONTRAST TECHNIQUE: Multidetector CT imaging of the chest was performed using the standard protocol during bolus administration of intravenous contrast. Multiplanar CT image reconstructions and MIPs were obtained to evaluate the vascular anatomy. CONTRAST:  ISOVUE-370 IOPAMIDOL (ISOVUE-370) INJECTION 76% COMPARISON:  None. FINDINGS: Cardiovascular: There are no filling defects in the pulmonary arterial tree to suggest acute pulmonary thromboembolism. Atherosclerotic calcifications in the aortic arch and great vessels. Mediastinum/Nodes: No abnormal mediastinal adenopathy. No pericardial effusion. Lungs/Pleura: Dependent atelectasis. There is bronchial wall thickening in the lower lobes. There is occlusion of small peripheral airways in the right lower lobe. Patchy airspace disease towards the posterior right lung base. Upper Abdomen: No acute abnormality. Musculoskeletal: No vertebral compression deformity. Review of the MIP images confirms the above findings. IMPRESSION: No evidence of acute pulmonary thromboembolism. There is bronchial wall thickening in both lower lobes right greater than left. There is occlusion of the small peripheral airways in the right lower lobe with  posterior basilar airspace disease in the right lower lobe. Aortic Atherosclerosis (ICD10-I70.0). Electronically Signed   By: Jolaine Click M.D.   On: 03/31/2018 13:55   Vas [place] Lower Extremity Venous (dvt) (only Mc & Wl)  Result Date: 03/29/2018  Lower Venous Study Indications: Swelling.  Performing Technologist: Gertie Fey MHA, RDMS, RVT, RDCS  Examination Guidelines: A complete evaluation includes B-mode imaging, spectral Doppler, color Doppler, and power Doppler as needed of all accessible portions of each vessel. Bilateral testing is considered an integral part of a complete examination. Limited examinations for reoccurring indications may be performed as noted.  Right Venous Findings: +---------+---------------+---------+-----------+----------+--------------+          CompressibilityPhasicitySpontaneityPropertiesSummary        +---------+---------------+---------+-----------+----------+--------------+ CFV      Full           No       Yes                  Pulsatile flow +---------+---------------+---------+-----------+----------+--------------+ SFJ  Full                                                        +---------+---------------+---------+-----------+----------+--------------+ FV Prox  Full                                                        +---------+---------------+---------+-----------+----------+--------------+ FV Mid   Full                                                        +---------+---------------+---------+-----------+----------+--------------+ FV DistalFull                                                        +---------+---------------+---------+-----------+----------+--------------+ PFV      Full                                                        +---------+---------------+---------+-----------+----------+--------------+ POP      Full           No       Yes                  Pulsatile flow  +---------+---------------+---------+-----------+----------+--------------+ PTV      Full                                                        +---------+---------------+---------+-----------+----------+--------------+ PERO     Full                                                        +---------+---------------+---------+-----------+----------+--------------+  Left Venous Findings: +---+---------------+---------+-----------+----------+-------+    CompressibilityPhasicitySpontaneityPropertiesSummary +---+---------------+---------+-----------+----------+-------+ CFVFull           Yes      Yes                          +---+---------------+---------+-----------+----------+-------+    Summary: Right: There is no evidence of deep vein thrombosis in the lower extremity. No cystic structure found in the popliteal fossa. Left: No evidence of common femoral vein obstruction.  *See table(s) above for measurements and observations. Electronically signed by Lemar Livings MD on 03/29/2018 at 11:16:55 AM.    Final      PERTINENT LAB RESULTS: CBC: Recent Labs    03/30/18 0329  WBC 11.3*  HGB 16.4*  HCT 53.5*  PLT 216   CMET CMP     Component Value Date/Time   NA 142 03/30/2018 0329   K 4.3 03/30/2018 0329   CL 101 03/30/2018 0329   CO2 33 (H) 03/30/2018 0329   GLUCOSE 129 (H) 03/30/2018 0329   BUN 9 03/30/2018 0329   CREATININE 0.65 03/30/2018 0329   CALCIUM 8.4 (L) 03/30/2018 0329   PROT 6.1 (L) 03/30/2018 0329   ALBUMIN 2.9 (L) 03/30/2018 0329   AST 18 03/30/2018 0329   ALT 23 03/30/2018 0329   ALKPHOS 50 03/30/2018 0329   BILITOT 0.5 03/30/2018 0329   GFRNONAA >60 03/30/2018 0329   GFRAA >60 03/30/2018 0329    GFR Estimated Creatinine Clearance: 102 mL/min (by C-G formula based on SCr of 0.65 mg/dL). No results for input(s): LIPASE, AMYLASE in the last 72 hours. No results for input(s): CKTOTAL, CKMB, CKMBINDEX, TROPONINI in the last 72 hours. Invalid input(s):  POCBNP No results for input(s): DDIMER in the last 72 hours. No results for input(s): HGBA1C in the last 72 hours. No results for input(s): CHOL, HDL, LDLCALC, TRIG, CHOLHDL, LDLDIRECT in the last 72 hours. No results for input(s): TSH, T4TOTAL, T3FREE, THYROIDAB in the last 72 hours.  Invalid input(s): FREET3 No results for input(s): VITAMINB12, FOLATE, FERRITIN, TIBC, IRON, RETICCTPCT in the last 72 hours. Coags: No results for input(s): INR in the last 72 hours.  Invalid input(s): PT Microbiology: Recent Results (from the past 240 hour(s))  Respiratory Panel by PCR     Status: None   Collection Time: 03/28/18  8:16 PM  Result Value Ref Range Status   Adenovirus NOT DETECTED NOT DETECTED Final   Coronavirus 229E NOT DETECTED NOT DETECTED Final   Coronavirus HKU1 NOT DETECTED NOT DETECTED Final   Coronavirus NL63 NOT DETECTED NOT DETECTED Final   Coronavirus OC43 NOT DETECTED NOT DETECTED Final   Metapneumovirus NOT DETECTED NOT DETECTED Final   Rhinovirus / Enterovirus NOT DETECTED NOT DETECTED Final   Influenza A NOT DETECTED NOT DETECTED Final   Influenza B NOT DETECTED NOT DETECTED Final   Parainfluenza Virus 1 NOT DETECTED NOT DETECTED Final   Parainfluenza Virus 2 NOT DETECTED NOT DETECTED Final   Parainfluenza Virus 3 NOT DETECTED NOT DETECTED Final   Parainfluenza Virus 4 NOT DETECTED NOT DETECTED Final   Respiratory Syncytial Virus NOT DETECTED NOT DETECTED Final   Bordetella pertussis NOT DETECTED NOT DETECTED Final   Chlamydophila pneumoniae NOT DETECTED NOT DETECTED Final   Mycoplasma pneumoniae NOT DETECTED NOT DETECTED Final    Comment: Performed at [facility], [address] [place], [place] [address]  Blood culture (routine x 2)     Status: None (Preliminary result)   Collection Time: 03/28/18  8:40 PM  Result Value Ref Range Status   Specimen Description BLOOD RIGHT HAND  Final   Special Requests   Final    BOTTLES DRAWN AEROBIC AND ANAEROBIC Blood  Culture adequate volume Performed at [facility], [address] [place], [place] [address]    Culture NO GROWTH 4 DAYS  Final   Report Status PENDING  Incomplete  Blood culture (routine x 2)     Status: None (Preliminary result)   Collection Time: 03/28/18  9:05 PM  Result Value Ref Range Status   Specimen Description BLOOD LEFT ANTECUBITAL  Final   Special Requests   Final    BOTTLES DRAWN AEROBIC AND ANAEROBIC Blood Culture adequate volume Performed at  [facility], [address] [place], [place] [address]    Culture NO GROWTH 4 DAYS  Final   Report Status PENDING  Incomplete    FURTHER DISCHARGE INSTRUCTIONS:  Get Medicines reviewed and adjusted: Please take all your medications with you for your next visit with your Primary MD  Laboratory/radiological data: Please request your Primary MD to go over all hospital tests and procedure/radiological results at the follow up, please ask your Primary MD to get all Hospital records sent to his/her office.  In some cases, they will be blood work, cultures and biopsy results pending at the time of your discharge. Please request that your primary care M.D. goes through all the records of your hospital data and follows up on these results.  Also Note the following: If you experience worsening of your admission symptoms, develop shortness of breath, life threatening emergency, suicidal or homicidal thoughts you must seek medical attention immediately by calling 911 or calling your MD immediately  if symptoms less severe.  You must read complete instructions/literature along with all the possible adverse reactions/side effects for all the Medicines you take and that have been prescribed to you. Take any new Medicines after you have completely understood and accpet all the possible adverse reactions/side effects.   Do not drive when taking Pain medications or sleeping medications (Benzodaizepines)  Do not take more than  prescribed Pain, Sleep and Anxiety Medications. It is not advisable to combine anxiety,sleep and pain medications without talking with your primary care practitioner  Special Instructions: If you have smoked or chewed Tobacco  in the last 2 yrs please stop smoking, stop any regular Alcohol  and or any Recreational drug use.  Wear Seat belts while driving.  Please note: You were cared for by a hospitalist during your hospital stay. Once you are discharged, your primary care physician will handle any further medical issues. Please note that NO REFILLS for any discharge medications will be authorized once you are discharged, as it is imperative that you return to your primary care physician (or establish a relationship with a primary care physician if you do not have one) for your post hospital discharge needs so that they can reassess your need for medications and monitor your lab values.  Total Time spent coordinating discharge including counseling, education and face to face time equals 45 minutes.  SignedJeoffrey Massed 04/01/2018 8:48 AM

## 2018-04-01 NOTE — Progress Notes (Signed)
Received call from bedside RN, patient has Augmentin added to her dc medication; patient given the MATCH Letter for her to take to her pharmacy to get her medication. MATCH letter explained to patient in detail. Abelino Derrick [facility] [phone]

## 2018-04-02 LAB — CULTURE, BLOOD (ROUTINE X 2)
CULTURE: NO GROWTH
Culture: NO GROWTH
Special Requests: ADEQUATE
Special Requests: ADEQUATE

## 2018-04-18 ENCOUNTER — Ambulatory Visit: Payer: Self-pay | Attending: Critical Care Medicine | Admitting: Critical Care Medicine

## 2018-04-18 ENCOUNTER — Encounter: Payer: Self-pay | Admitting: Critical Care Medicine

## 2018-04-18 ENCOUNTER — Other Ambulatory Visit: Payer: Self-pay

## 2018-04-18 VITALS — BP 123/77 | HR 107 | Temp 98.3°F | Resp 28 | Ht 68.0 in | Wt 256.0 lb

## 2018-04-18 DIAGNOSIS — Z87891 Personal history of nicotine dependence: Secondary | ICD-10-CM | POA: Insufficient documentation

## 2018-04-18 DIAGNOSIS — Z833 Family history of diabetes mellitus: Secondary | ICD-10-CM | POA: Insufficient documentation

## 2018-04-18 DIAGNOSIS — Z7951 Long term (current) use of inhaled steroids: Secondary | ICD-10-CM | POA: Insufficient documentation

## 2018-04-18 DIAGNOSIS — R600 Localized edema: Secondary | ICD-10-CM | POA: Insufficient documentation

## 2018-04-18 DIAGNOSIS — J9601 Acute respiratory failure with hypoxia: Secondary | ICD-10-CM | POA: Insufficient documentation

## 2018-04-18 DIAGNOSIS — Z79899 Other long term (current) drug therapy: Secondary | ICD-10-CM | POA: Insufficient documentation

## 2018-04-18 DIAGNOSIS — R21 Rash and other nonspecific skin eruption: Secondary | ICD-10-CM

## 2018-04-18 DIAGNOSIS — J441 Chronic obstructive pulmonary disease with (acute) exacerbation: Secondary | ICD-10-CM | POA: Insufficient documentation

## 2018-04-18 MED ORDER — TIOTROPIUM BROMIDE MONOHYDRATE 18 MCG IN CAPS: 18.0000 ug | ORAL_CAPSULE | Freq: Every day | RESPIRATORY_TRACT | 6 refills | Status: DC

## 2018-04-18 MED ORDER — GUAIFENESIN ER 600 MG PO TB12: 600.0000 mg | ORAL_TABLET | Freq: Two times a day (BID) | ORAL | 1 refills | Status: DC

## 2018-04-18 MED ORDER — BUDESONIDE-FORMOTEROL FUMARATE 160-4.5 MCG/ACT IN AERO: 2.0000 | INHALATION_SPRAY | Freq: Two times a day (BID) | RESPIRATORY_TRACT | 12 refills | Status: DC

## 2018-04-18 MED ORDER — ALBUTEROL SULFATE HFA 108 (90 BASE) MCG/ACT IN AERS: 2.0000 | INHALATION_SPRAY | Freq: Four times a day (QID) | RESPIRATORY_TRACT | 3 refills | Status: DC | PRN

## 2018-04-18 NOTE — Patient Instructions (Signed)
Stay on Symbicort 2 puff twice daily and Spiriva once daily, refills were sent to our pharmacy and you will be enrolled in patient assistance program  Use albuterol 2 puffs every 6 hours as needed for shortness of breath  Continue to focus on smoking cessation using the nicotine patch  Continue oxygen 2 L at night when sleeping and as needed during the daytime  You may use the Mucinex 1 twice daily and a refill was sent to our pharmacy  Return for follow-up recheck in 3 weeks with Dr. Delford Field

## 2018-04-18 NOTE — Assessment & Plan Note (Signed)
Ambulatory pulse oxygen was checked was down to 86% with a heart rate over 100 but she immediately recovered to greater than 90% with rest  The patient is to continue oxygen 2 L at night and as needed during the day

## 2018-04-18 NOTE — Assessment & Plan Note (Signed)
Chronic obstructive lung disease with acute exacerbation and hypoxic respiratory failure  Plan The patient was reinstructed as to proper use of HFA and DPI inhalers Continue Symbicort 2 puffs twice daily Continue Spiriva 1 capsule inhaled twice on the capsule daily Reduce oxygen 2 L at bedtime as needed during the day Note ambulatory saturations dropped to 86% with ambulation quickly recovering above 90% at rest

## 2018-04-18 NOTE — Progress Notes (Signed)
Subjective:    Patient ID: Evelyn Peterson, female    DOB: Oct 05, 1960, 58 y.o.   MRN: [id number]  58 y.o.F here for post hosp f/u This patient was admitted on 03/28/2018 discharged on 01 April 2018 for COPD exacerbation.   The patient was sent home with oxygen 2 L continuous along with inhaled medications.  She also had a course of prednisone which she has now tapered off of.  Below are excerpts from the discharge summary.   Brief Summary: See H&P, Labs, Consult and Test reports for all details in brief, Patient is a58 y.o.femalewith longstanding history of tobacco use (>40 pack years) presented to the hospital with right lower extremity rash, shortness of breath-and subsequently admitted for COPD exacerbation. See below for details  Brief Hospital Course: Acute hypoxic respiratory failure secondary to COPD exacerbation and community-acquired pneumonia: Much improved-hardly any wheezing-treated with steroids, bronchodilators and empiric antimicrobial therapy.  Echocardiogram with preserved EF, CT angiogram without PE but showed mucous plugging and possible pneumonia-will be transition to Augmentin for a few more days on discharge.  Patient does not have a formal diagnosis of COPD, however given a long history of smoking and her clinical presentation-it is thought that she probably has undiagnosed COPD.  Once she is back to her baseline-she will require formal PFTs-but this can be done in the outpatient setting.  Even with clinical improvement-she is persistently hypoxic-hence being discharged on home O2.  [facility] for a post hospital discharge follow-up appointment.  She will be placed on Symbicort, Spiriva, tapering prednisone on discharge.  Extensive counseling regarding importance of tobacco cessation done during this hospital stay.  Right lower extremity rash:Significantly improved-see picture taken on 1/2. Not sure if  improvement is due to steroids that she is on for COPD. If rash persists over the next few weeks-patient aware that she probably needs outpatient dermatology referral.   Tobacco abuse: Counseled-has a 40+ year history of smoking-although does not have a formal diagnosis of COPD-high suspicion given clinical scenario. Needs formal PFTs in the outpatient setting.  Procedures/Studies: None  Discharge Diagnoses:  Principal Problem:   COPD with acute exacerbation (HCC) Active Problems:   Peripheral edema   Acute respiratory failure with hypoxia (HCC) The patient smoked 15 cigarettes a day for 40 years but has now stopped smoking The patient notes the rash in her right lower extremity has resolved.  Her dyspnea is better.  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    Shortness of Breath  This is a chronic problem. The current episode started more than 1 month ago. The problem occurs daily (dyspnea is better, worse at night). The problem has been rapidly improving (sats 92-93%  88% at night ). Associated symptoms include PND, a rash, sputum production and wheezing. Pertinent negatives include no chest pain, ear pain, fever, hemoptysis, leg swelling, sore throat or vomiting. The symptoms are aggravated by weather changes, lying flat, occupational exposure, fumes, odors, any activity and exercise (degreaser at work, bleach at work ). Risk factors include smoking. She has tried oral steroids, beta agonist inhalers and ipratropium inhalers for the symptoms. The treatment provided significant relief. Her past medical history is significant for pneumonia. There is no history of asthma, CAD, a heart failure or PE.    Past Medical History:  Diagnosis Date  . COPD (chronic obstructive pulmonary disease) (HCC)   .  Edema of lower extremity 03/2018     Family History  Problem Relation Age of Onset  . Conductive hearing loss Mother   .  Diabetes Mother   . Heart disease Mother        AMI/CHF  . Hypertension Mother   . Diabetes Father   . Diabetes Sister   . High blood pressure Sister   . High blood pressure Sister   . Diabetes Sister      Social History   Socioeconomic History  . Marital status: Single    Spouse name: Not on file  . Number of children: Not on file  . Years of education: Not on file  . Highest education level: Not on file  Occupational History    Employer: [employer]    Comment: Waitress  Social Needs  . Financial resource strain: Not on file  . Food insecurity:    Worry: Not on file    Inability: Not on file  . Transportation needs:    Medical: Not on file    Non-medical: Not on file  Tobacco Use  . Smoking status: Former Smoker    Packs/day: 1.00    Years: 40.00    Pack years: 40.00    Types: Cigarettes    Last attempt to quit: 04/11/2018    Years since quitting: 0.0  . Smokeless tobacco: Never Used  Substance and Sexual Activity  . Alcohol use: Yes    Comment: beer- weekly  . Drug use: No  . Sexual activity: Not on file  Lifestyle  . Physical activity:    Days per week: Not on file    Minutes per session: Not on file  . Stress: Not on file  Relationships  . Social connections:    Talks on phone: Not on file    Gets together: Not on file    Attends religious service: Not on file    Active member of club or organization: Not on file    Attends meetings of clubs or organizations: Not on file    Relationship status: Not on file  . Intimate partner violence:    Fear of current or ex partner: Not on file    Emotionally abused: Not on file    Physically abused: Not on file    Forced sexual activity: Not on file  Other Topics Concern  . Not on file  Social History Narrative  . Not on file     No Known Allergies   Outpatient Medications Prior to Visit  Medication Sig Dispense Refill  . nicotine (NICODERM CQ - DOSED IN MG/24 HOURS) 21 mg/24hr patch Place 1 patch (21 mg total)  onto the skin daily. 28 patch 0  . budesonide-formoterol (SYMBICORT) 160-4.5 MCG/ACT inhaler Inhale 2 puffs into the lungs 2 (two) times daily. 1 Inhaler 12  . guaiFENesin (MUCINEX) 600 MG 12 hr tablet Take 1 tablet (600 mg total) by mouth 2 (two) times daily. 10 tablet 0  . tiotropium (SPIRIVA HANDIHALER) 18 MCG inhalation capsule Place 1 capsule (18 mcg total) into inhaler and inhale daily. 30 capsule 0  . albuterol (PROVENTIL HFA;VENTOLIN HFA) 108 (90 Base) MCG/ACT inhaler Inhale 2 puffs into the lungs every 6 (six) hours as needed for wheezing or shortness of breath. (Patient not taking: Reported on 04/18/2018) 1 Inhaler 0  . amoxicillin-clavulanate (AUGMENTIN) 875-125 MG tablet Take 1 tablet by mouth every 12 (twelve) hours. (Patient not taking: Reported on 04/18/2018) 8 tablet 0  . predniSONE (DELTASONE) 10 MG tablet  Take 40 mg daily for 2 days, then, 30 mg daily for 2 days, then,  20 mg daily for 2 days, then, 10 mg daily for 1 day, then stop (Patient not taking: Reported on 04/18/2018) 19 tablet 0   No facility-administered medications prior to visit.      Review of Systems  Constitutional: Negative for fever.  HENT: Negative for ear pain and sore throat.   Respiratory: Positive for sputum production, shortness of breath and wheezing. Negative for hemoptysis.   Cardiovascular: Positive for PND. Negative for chest pain and leg swelling.  Gastrointestinal: Negative for vomiting.  Skin: Positive for rash.       Objective:   Physical Exam Vitals:   04/18/18 1355  BP: 123/77  Pulse: (!) 107  Resp: (!) 28  Temp: 98.3 F (36.8 C)  TempSrc: Oral  SpO2: (!) 89%  Weight: 256 lb (116.1 kg)  Height: 5\' 8"  (1.727 m)    Gen: Pleasant, well-nourished, in no distress,  normal affect  ENT: No lesions,  mouth clear,  oropharynx clear, no postnasal drip  Neck: No JVD, no TMG, no carotid bruits  Lungs: No use of accessory muscles, no dullness to percussion,  Distant breath  sounds  Cardiovascular: RRR, heart sounds normal, no murmur or gallops, no peripheral edema  Abdomen: soft and NT, no HSM,  BS normal  Musculoskeletal: No deformities, no cyanosis or clubbing  Neuro: alert, non focal  Skin: Warm, no lesions or rashes  No results found.        Assessment & Plan:  I personally reviewed all images and lab data in the [facility] system as well as any outside material available during this office visit and agree with the  radiology impressions.   COPD with acute exacerbation (HCC) Chronic obstructive lung disease with acute exacerbation and hypoxic respiratory failure  Plan The patient was reinstructed as to proper use of HFA and DPI inhalers Continue Symbicort 2 puffs twice daily Continue Spiriva 1 capsule inhaled twice on the capsule daily Reduce oxygen 2 L at bedtime as needed during the day Note ambulatory saturations dropped to 86% with ambulation quickly recovering above 90% at rest    Acute respiratory failure with hypoxia (HCC) Ambulatory pulse oxygen was checked was down to 86% with a heart rate over 100 but she immediately recovered to greater than 90% with rest  The patient is to continue oxygen 2 L at night and as needed during the day   Evelyn Peterson was seen today for hospitalization follow-up.  Diagnoses and all orders for this visit:  COPD with acute exacerbation (HCC)  Acute respiratory failure with hypoxia (HCC)  Other orders -     tiotropium (SPIRIVA HANDIHALER) 18 MCG inhalation capsule; Place 1 capsule (18 mcg total) into inhaler and inhale daily. -     guaiFENesin (MUCINEX) 600 MG 12 hr tablet; Take 1 tablet (600 mg total) by mouth 2 (two) times daily. -     budesonide-formoterol (SYMBICORT) 160-4.5 MCG/ACT inhaler; Inhale 2 puffs into the lungs 2 (two) times daily. -     albuterol (PROVENTIL HFA;VENTOLIN HFA) 108 (90 Base) MCG/ACT inhaler; Inhale 2 puffs into the lungs every 6 (six) hours as needed for wheezing or shortness of  breath.

## 2018-04-18 NOTE — Progress Notes (Signed)
Has home O2- 2L Uses at home HS

## 2018-04-19 MED FILL — !VENTOLIN HFA INHALER: 108 (90 BAS | 25 days supply | Qty: 18 | Fill #0

## 2018-05-09 ENCOUNTER — Encounter: Payer: Self-pay | Admitting: Critical Care Medicine

## 2018-05-09 ENCOUNTER — Ambulatory Visit: Payer: Self-pay | Attending: Critical Care Medicine | Admitting: Critical Care Medicine

## 2018-05-09 ENCOUNTER — Other Ambulatory Visit: Payer: Self-pay | Admitting: Pharmacist

## 2018-05-09 ENCOUNTER — Other Ambulatory Visit: Payer: Self-pay

## 2018-05-09 VITALS — BP 134/69 | HR 103 | Temp 98.5°F | Resp 20

## 2018-05-09 DIAGNOSIS — Z79899 Other long term (current) drug therapy: Secondary | ICD-10-CM | POA: Insufficient documentation

## 2018-05-09 DIAGNOSIS — Z1211 Encounter for screening for malignant neoplasm of colon: Secondary | ICD-10-CM

## 2018-05-09 DIAGNOSIS — Z23 Encounter for immunization: Secondary | ICD-10-CM | POA: Insufficient documentation

## 2018-05-09 DIAGNOSIS — Z833 Family history of diabetes mellitus: Secondary | ICD-10-CM | POA: Insufficient documentation

## 2018-05-09 DIAGNOSIS — Z1159 Encounter for screening for other viral diseases: Secondary | ICD-10-CM

## 2018-05-09 DIAGNOSIS — Z87891 Personal history of nicotine dependence: Secondary | ICD-10-CM | POA: Insufficient documentation

## 2018-05-09 DIAGNOSIS — J441 Chronic obstructive pulmonary disease with (acute) exacerbation: Secondary | ICD-10-CM

## 2018-05-09 DIAGNOSIS — Z1231 Encounter for screening mammogram for malignant neoplasm of breast: Secondary | ICD-10-CM

## 2018-05-09 MED ORDER — UMECLIDINIUM BROMIDE 62.5 MCG/INH IN AEPB: 1.0000 | INHALATION_SPRAY | Freq: Every day | RESPIRATORY_TRACT | 2 refills | Status: DC

## 2018-05-09 MED ORDER — FLUTICASONE PROPIONATE 50 MCG/ACT NA SUSP: 2.0000 | Freq: Every day | NASAL | 6 refills | Status: DC

## 2018-05-09 MED FILL — !INCRUSE ELLIPTA 62.5 MCG I: 62.5 | 30 days supply | Qty: 30 | Fill #0

## 2018-05-09 MED FILL — !SYMBICORT 160-4.5 MCG INH: 160-4.5 | 30 days supply | Qty: 1 | Fill #0

## 2018-05-09 NOTE — Patient Instructions (Signed)
Refills on fluticasone was sent to our pharmacy take 2 sprays each nostril daily and you can use her supply from the hospital first  Stay on Symbicort and Incruse as you are taking  Continue to focus on smoking cessation  A tetanus vaccine was given today  We will schedule you for a mammogram and also give you a stool card to provide a stool sample for colon cancer screening  Blood work today will include a hepatitis C antibody screen  Return to see Dr. Delford Field in 2 months  A primary care visit will be scheduled in 6 weeks that will include a Pap smear

## 2018-05-09 NOTE — Assessment & Plan Note (Signed)
COPD with recent exacerbation now improving  Continue smoking cessation Continue Symbicort and Incruse daily The patient can now return to full employment

## 2018-05-09 NOTE — Progress Notes (Signed)
Subjective:    Patient ID: Evelyn Peterson, female    DOB: [birth year], 58 y.o.   MRN: [id number]  58 y.o.F with COPD and recent admission This patient was admitted on 03/28/2018 discharged on 01 April 2018 for COPD exacerbation.   The patient was sent home with oxygen 2 L continuous along with inhaled medications.  She also had a course of prednisone which she has now tapered off of.   05/09/2018 Note since the last office visit the patient is improved with COPD symptoms.  There is less shortness of breath wheezing and cough.  The patient is now on the Incruse and Symbicort daily. Patient does need primary care to establish and is in need of a hepatitis C screen, tetanus vaccine, mammogram, stool for colon cancer screening and Pap smear. Note the patient would like to return to work  Note the cough is less and there is less mucus production.  There is some increased postnasal drainage and drainage from the nose area.     Shortness of Breath  This is a chronic problem. The current episode started more than 1 month ago. The problem occurs daily (dyspnea is better, worse at night). The problem has been unchanged (sats 92-93%  88% at night ). Associated symptoms include rhinorrhea, a sore throat, sputum production and wheezing. Pertinent negatives include no chest pain, ear pain, fever, hemoptysis, leg swelling, PND, rash or vomiting. The symptoms are aggravated by weather changes, lying flat, occupational exposure, fumes, odors, any activity and exercise (degreaser at work, bleach at work ). Risk factors include smoking. She has tried oral steroids, beta agonist inhalers and ipratropium inhalers for the symptoms. The treatment provided significant relief. Her past medical history is significant for pneumonia. There is no history of asthma, CAD, a heart failure or PE.    Past Medical History:  Diagnosis Date  . COPD (chronic obstructive pulmonary disease) (HCC)   . Edema of lower extremity 03/2018      Family History  Problem Relation Age of Onset  . Conductive hearing loss Mother   . Diabetes Mother   . Heart disease Mother        AMI/CHF  . Hypertension Mother   . Diabetes Father   . Diabetes Sister   . High blood pressure Sister   . High blood pressure Sister   . Diabetes Sister      Social History   Socioeconomic History  . Marital status: Single    Spouse name: Not on file  . Number of children: Not on file  . Years of education: Not on file  . Highest education level: Not on file  Occupational History    Employer: [employer]    Comment: Waitress  Social Needs  . Financial resource strain: Not on file  . Food insecurity:    Worry: Not on file    Inability: Not on file  . Transportation needs:    Medical: Not on file    Non-medical: Not on file  Tobacco Use  . Smoking status: Former Smoker    Packs/day: 1.00    Years: 40.00    Pack years: 40.00    Types: Cigarettes    Last attempt to quit: 04/11/2018    Years since quitting: 0.0  . Smokeless tobacco: Never Used  Substance and Sexual Activity  . Alcohol use: Yes    Comment: beer- weekly  . Drug use: No  . Sexual activity: Not on file  Lifestyle  . Physical activity:  Days per week: Not on file    Minutes per session: Not on file  . Stress: Not on file  Relationships  . Social connections:    Talks on phone: Not on file    Gets together: Not on file    Attends religious service: Not on file    Active member of club or organization: Not on file    Attends meetings of clubs or organizations: Not on file    Relationship status: Not on file  . Intimate partner violence:    Fear of current or ex partner: Not on file    Emotionally abused: Not on file    Physically abused: Not on file    Forced sexual activity: Not on file  Other Topics Concern  . Not on file  Social History Narrative  . Not on file     No Known Allergies   Outpatient Medications Prior to Visit  Medication Sig Dispense Refill    . albuterol (PROVENTIL HFA;VENTOLIN HFA) 108 (90 Base) MCG/ACT inhaler Inhale 2 puffs into the lungs every 6 (six) hours as needed for wheezing or shortness of breath. 1 Inhaler 3  . budesonide-formoterol (SYMBICORT) 160-4.5 MCG/ACT inhaler Inhale 2 puffs into the lungs 2 (two) times daily. 1 Inhaler 12  . guaiFENesin (MUCINEX) 600 MG 12 hr tablet Take 1 tablet (600 mg total) by mouth 2 (two) times daily. 60 tablet 1  . nicotine (NICODERM CQ - DOSED IN MG/24 HOURS) 21 mg/24hr patch Place 1 patch (21 mg total) onto the skin daily. 28 patch 0  . umeclidinium bromide (INCRUSE ELLIPTA) 62.5 MCG/INH AEPB Inhale 1 puff into the lungs daily. 30 each 2  . tiotropium (SPIRIVA HANDIHALER) 18 MCG inhalation capsule Place 1 capsule (18 mcg total) into inhaler and inhale daily. 30 capsule 6   No facility-administered medications prior to visit.      Review of Systems  Constitutional: Negative for fever.  HENT: Positive for rhinorrhea and sore throat. Negative for ear pain.   Respiratory: Positive for sputum production, shortness of breath and wheezing. Negative for hemoptysis.   Cardiovascular: Negative for chest pain, leg swelling and PND.  Gastrointestinal: Negative for vomiting.  Skin: Negative for rash.       Objective:   Physical Exam Vitals:   05/09/18 1450  BP: 134/69  Pulse: (!) 103  Resp: 20  Temp: 98.5 F (36.9 C)  TempSrc: Oral  SpO2: 92%    Gen: Pleasant, well-nourished, in no distress,  normal affect  ENT: No lesions,  mouth clear,  oropharynx clear, no postnasal drip  Neck: No JVD, no TMG, no carotid bruits  Lungs: No use of accessory muscles, no dullness to percussion,  Distant breath sounds  Cardiovascular: RRR, heart sounds normal, no murmur or gallops, no peripheral edema  Abdomen: soft and NT, no HSM,  BS normal  Musculoskeletal: No deformities, no cyanosis or clubbing  Neuro: alert, non focal  Skin: Warm, no lesions or rashes  No results found.         Assessment & Plan:  I personally reviewed all images and lab [facility] as well as any outside material available during this office visit and agree with the  radiology impressions.   COPD with acute exacerbation (HCC) COPD with recent exacerbation now improving  Continue smoking cessation Continue Symbicort and Incruse daily The patient can now return to full employment   Evelyn DandyMary was seen today for follow-up.  Diagnoses and all orders for this  visit:  COPD with acute exacerbation (HCC)  Need for hepatitis C screening test -     Hepatitis C Antibody  Breast cancer screening by mammogram -     MM DIGITAL SCREENING BILATERAL; Future  Colon cancer screening -     Fecal occult blood, imunochemical; Future  Other orders -     fluticasone (FLONASE) 50 MCG/ACT nasal spray; Place 2 sprays into both nostrils daily.  Note a mammogram, hepatitis C antibody, and fecal blood immunochemical be obtained  A tetanus vaccine will be administered

## 2018-05-09 NOTE — Progress Notes (Signed)
Cold sx's  Sore throat, runny nose, cough with yellow phlegm  Denies fever Taking mucinex  Intermittent SOB

## 2018-05-10 LAB — HEPATITIS C ANTIBODY: Hep C Virus Ab: <0.1 s/co ratio (ref 0.0–0.9)

## 2018-06-20 ENCOUNTER — Ambulatory Visit: Payer: Self-pay | Admitting: Nurse Practitioner

## 2018-07-04 ENCOUNTER — Telehealth (HOSPITAL_COMMUNITY): Payer: Self-pay

## 2018-07-04 NOTE — Telephone Encounter (Signed)
Returned patient's phone call. Left name and number if patient still needs assistance from the [facility] program.

## 2018-07-11 ENCOUNTER — Ambulatory Visit: Payer: Self-pay | Attending: Critical Care Medicine | Admitting: Critical Care Medicine

## 2018-07-11 ENCOUNTER — Encounter: Payer: Self-pay | Admitting: Critical Care Medicine

## 2018-07-11 ENCOUNTER — Other Ambulatory Visit: Payer: Self-pay

## 2018-07-11 DIAGNOSIS — J441 Chronic obstructive pulmonary disease with (acute) exacerbation: Secondary | ICD-10-CM

## 2018-07-11 NOTE — Progress Notes (Deleted)
Subjective:    Patient ID: Evelyn Peterson, female    DOB: May 16, 1960, 58 y.o.   MRN: [id number] Virtual Visit via Telephone Note  I connected with Evelyn Peterson on 07/11/18 at  1:30 PM EDT by telephone and verified that I am speaking with the correct person using two identifiers.   I discussed the limitations, risks, security and privacy concerns of performing an evaluation and management service by telephone and the availability of in person appointments. I also discussed with the patient that there may be a patient responsible charge related to this service. The patient expressed understanding and agreed to proceed.   History of Present Illness:  Last OV 05/09/18. Telephone visit today confirmed with the patient and      58 y.o.F with COPD and recent admission This patient was admitted on 03/28/2018 discharged on 01 April 2018 for COPD exacerbation.   The patient was sent home with oxygen 2 L continuous along with inhaled medications.  She also had a course of prednisone which she has now tapered off of.   05/09/2018 Note since the last office visit the patient is improved with COPD symptoms.  There is less shortness of breath wheezing and cough.  The patient is now on the Incruse and Symbicort daily. Patient does need primary care to establish and is in need of a hepatitis C screen, tetanus vaccine, mammogram, stool for colon cancer screening and Pap smear. Note the patient would like to return to work  Note the cough is less and there is less mucus production.  There is some increased postnasal drainage and drainage from the nose area.    07/11/2018 Pt seen via telephone virtual visit.  At last OV we continued symbicort/incruse and pt was improved.     Shortness of Breath  This is a chronic problem. The current episode started more than 1 month ago. The problem occurs daily (dyspnea is better, worse at night). The problem has been unchanged (sats 92-93%  88% at night ). Associated  symptoms include rhinorrhea, a sore throat, sputum production and wheezing. Pertinent negatives include no chest pain, ear pain, fever, hemoptysis, leg swelling, PND, rash or vomiting. The symptoms are aggravated by weather changes, lying flat, occupational exposure, fumes, odors, any activity and exercise (degreaser at work, bleach at work ). Risk factors include smoking. She has tried oral steroids, beta agonist inhalers and ipratropium inhalers for the symptoms. The treatment provided significant relief. Her past medical history is significant for pneumonia. There is no history of asthma, CAD, a heart failure or PE.    Review of Systems  Constitutional: Negative for fever.  HENT: Positive for rhinorrhea and sore throat. Negative for ear pain.   Respiratory: Positive for sputum production, shortness of breath and wheezing. Negative for hemoptysis.   Cardiovascular: Negative for chest pain, leg swelling and PND.  Gastrointestinal: Negative for vomiting.  Skin: Negative for rash.    Observations/Objective: No observations as was a telephone visit  Assessment and Plan:   Follow Up Instructions:    I discussed the assessment and treatment plan with the patient. The patient was provided an opportunity to ask questions and all were answered. The patient agreed with the plan and demonstrated an understanding of the instructions.   The patient was advised to call back or seek an in-person evaluation if the symptoms worsen or if the condition fails to improve as anticipated.  I provided *** minutes of non-face-to-face time during this encounter.   Shan LevansPatrick Kimberlee Shoun,  MD

## 2018-07-11 NOTE — Progress Notes (Signed)
Number on patient's [phone]) is not in service. Checked DPR scanned in chart & the number she wrote was [phone]). Called that number & left a voicemail stating that I was trying to initiate her phone visit with provider.

## 2018-08-07 ENCOUNTER — Ambulatory Visit: Payer: Self-pay | Admitting: Nurse Practitioner

## 2018-08-28 ENCOUNTER — Ambulatory Visit: Payer: Self-pay | Admitting: Nurse Practitioner

## 2018-09-04 ENCOUNTER — Ambulatory Visit: Payer: Self-pay | Admitting: Critical Care Medicine

## 2018-10-27 ENCOUNTER — Other Ambulatory Visit: Payer: Self-pay

## 2018-10-27 ENCOUNTER — Ambulatory Visit
Admission: RE | Admit: 2018-10-27 | Discharge: 2018-10-27 | Disposition: A | Discharge status: Home / Self Care | Payer: No Typology Code available for payment source | Source: Ambulatory Visit | Attending: Critical Care Medicine | Admitting: Critical Care Medicine

## 2018-10-27 DIAGNOSIS — Z1231 Encounter for screening mammogram for malignant neoplasm of breast: Secondary | ICD-10-CM

## 2018-11-15 ENCOUNTER — Ambulatory Visit: Payer: Self-pay | Attending: Critical Care Medicine | Admitting: Critical Care Medicine

## 2018-11-15 ENCOUNTER — Other Ambulatory Visit: Payer: Self-pay

## 2018-11-15 ENCOUNTER — Encounter: Payer: Self-pay | Admitting: Critical Care Medicine

## 2018-11-15 DIAGNOSIS — Z1211 Encounter for screening for malignant neoplasm of colon: Secondary | ICD-10-CM

## 2018-11-15 DIAGNOSIS — F17210 Nicotine dependence, cigarettes, uncomplicated: Secondary | ICD-10-CM

## 2018-11-15 DIAGNOSIS — Z720 Tobacco use: Secondary | ICD-10-CM | POA: Insufficient documentation

## 2018-11-15 DIAGNOSIS — J441 Chronic obstructive pulmonary disease with (acute) exacerbation: Secondary | ICD-10-CM

## 2018-11-15 MED ORDER — INCRUSE ELLIPTA 62.5 MCG/INH IN AEPB: 1.0000 | INHALATION_SPRAY | Freq: Every day | RESPIRATORY_TRACT | 6 refills | Status: DC

## 2018-11-15 MED ORDER — ALBUTEROL SULFATE HFA 108 (90 BASE) MCG/ACT IN AERS: 2.0000 | INHALATION_SPRAY | Freq: Four times a day (QID) | RESPIRATORY_TRACT | 1 refills | Status: DC | PRN

## 2018-11-15 MED FILL — !VENTOLIN HFA INHALER: 108 (90 BAS | 25 days supply | Qty: 18 | Fill #0

## 2018-11-15 MED FILL — !INCRUSE ELLIPTA 62.5 MCG I: 62.5 | 30 days supply | Qty: 30 | Fill #0

## 2018-11-15 NOTE — Progress Notes (Signed)
Subjective:    Patient ID: Evelyn MouldsMary Jo Ann Peterson, female    DOB: March 17, 1961, 58 y.o.   MRN: [id number] Virtual Visit via Telephone Note  I connected with Evelyn DingsMary Jo Ann Peterson on 11/15/18 at  9:00 AM EDT by telephone and verified that I am speaking with the correct person using two identifiers.   Consent:  I discussed the limitations, risks, security and privacy concerns of performing an evaluation and management service by telephone and the availability of in person appointments. I also discussed with the patient that there may be a patient responsible charge related to this service. The patient expressed understanding and agreed to proceed.  Location of patient:  Location of provider:  Persons participating in the televisit with the patient.       History of Present Illness: 58 y.o.F with COPD and ongoing tobacco use.   This is a telephone visit.   Since the last office visit in February the patient has lost her job as [employer] she worked in close because of COVID.  She has had some degree of stress with this and started smoking again up to 10 cigarettes daily.  She does have adequate supply of Symbicort maintains this to inhalations twice daily but is ran out of her Incruse.  The patient is in need to establish for primary care and does need a Pap smear.   Shortness of Breath This is a chronic problem. The current episode started more than 1 year ago. The problem occurs daily (dyspnea is better, worse at night). The problem has been unchanged (sats 92-93%  88% at night ). Pertinent negatives include no chest pain, ear pain, fever, hemoptysis, leg swelling, PND, rash, rhinorrhea, sore throat, sputum production, vomiting or wheezing. The symptoms are aggravated by weather changes, lying flat, occupational exposure, fumes, odors, any activity and exercise (degreaser at work, bleach at work ). Risk factors include smoking. She has tried oral steroids, beta agonist inhalers and ipratropium  inhalers for the symptoms. The treatment provided significant relief. Her past medical history is significant for pneumonia. There is no history of asthma, CAD, a heart failure or PE.    Past Medical History:  Diagnosis Date  . COPD (chronic obstructive pulmonary disease) (HCC)   . Edema of lower extremity 03/2018     Family History  Problem Relation Age of Onset  . Conductive hearing loss Mother   . Diabetes Mother   . Heart disease Mother        AMI/CHF  . Hypertension Mother   . Diabetes Father   . Diabetes Sister   . High blood pressure Sister   . High blood pressure Sister   . Diabetes Sister      Social History   Socioeconomic History  . Marital status: Single    Spouse name: Not on file  . Number of children: Not on file  . Years of education: Not on file  . Highest education level: Not on file  Occupational History    Employer: [employer]    Comment: Waitress  Social Needs  . Financial resource strain: Not on file  . Food insecurity    Worry: Not on file    Inability: Not on file  . Transportation needs    Medical: Not on file    Non-medical: Not on file  Tobacco Use  . Smoking status: Former Smoker    Packs/day: 1.00    Years: 40.00    Pack years: 40.00    Types:  Cigarettes    Quit date: 04/11/2018    Years since quitting: 0.5  . Smokeless tobacco: Never Used  Substance and Sexual Activity  . Alcohol use: Yes    Comment: beer- weekly  . Drug use: No  . Sexual activity: Not on file  Lifestyle  . Physical activity    Days per week: Not on file    Minutes per session: Not on file  . Stress: Not on file  Relationships  . Social Musicianconnections    Talks on phone: Not on file    Gets together: Not on file    Attends religious service: Not on file    Active member of club or organization: Not on file    Attends meetings of clubs or organizations: Not on file    Relationship status: Not on file  . Intimate partner violence    Fear of current or ex partner:  Not on file    Emotionally abused: Not on file    Physically abused: Not on file    Forced sexual activity: Not on file  Other Topics Concern  . Not on file  Social History Narrative  . Not on file     No Known Allergies   Outpatient Medications Prior to Visit  Medication Sig Dispense Refill  . albuterol (PROVENTIL HFA;VENTOLIN HFA) 108 (90 Base) MCG/ACT inhaler Inhale 2 puffs into the lungs every 6 (six) hours as needed for wheezing or shortness of breath. 1 Inhaler 3  . budesonide-formoterol (SYMBICORT) 160-4.5 MCG/ACT inhaler Inhale 2 puffs into the lungs 2 (two) times daily. 1 Inhaler 12  . fluticasone (FLONASE) 50 MCG/ACT nasal spray Place 2 sprays into both nostrils daily. 16 g 6  . umeclidinium bromide (INCRUSE ELLIPTA) 62.5 MCG/INH AEPB Inhale 1 puff into the lungs daily. 30 each 2   No facility-administered medications prior to visit.      Review of Systems  Constitutional: Negative for fever.  HENT: Negative for ear pain, rhinorrhea and sore throat.   Respiratory: Positive for shortness of breath. Negative for hemoptysis, sputum production and wheezing.   Cardiovascular: Negative for chest pain, leg swelling and PND.  Gastrointestinal: Negative for vomiting.  Skin: Negative for rash.    Observations/Objective: No observations were made as this is a telephone visit        Assessment & Plan:  I personally reviewed all [facility] as well as any outside material available during this office visit and agree with the  radiology impressions.  #1 COPD Gold stage C with recurrent exacerbation with ongoing tobacco use  Plan will be to refill and cruise mail this to the patient's home and obtain financial assistance also refill the Symbicort as well.  #2 tobacco use  I spent 5 to 10 minutes on smoking cessation counseling during this telephone visit  #3 need for primary care access and a Pap smear  The patient will receive a fecal card in the  mail for colon cancer screening and we will establish a primary care visit upcoming for a Pap smear she also needs a flu vaccine which will be given when she comes in for the Pap smear     I discussed the assessment and treatment plan with the patient. The patient was provided an opportunity to ask questions and all were answered. The patient agreed with the plan and demonstrated an understanding of the instructions.   The patient was advised to call back or seek an in-person  evaluation if the symptoms worsen or if the condition fails to improve as anticipated.  I provided 30 minutes of non-face-to-face time during this encounter  including  median intraservice time , review of notes, labs, imaging, medications  and explaining diagnosis and management to the patient .    Asencion Noble, MD

## 2018-11-20 ENCOUNTER — Ambulatory Visit: Payer: Self-pay | Attending: Nurse Practitioner | Admitting: Nurse Practitioner

## 2018-11-20 ENCOUNTER — Other Ambulatory Visit: Payer: Self-pay

## 2018-11-20 ENCOUNTER — Encounter: Payer: Self-pay | Admitting: Nurse Practitioner

## 2018-11-20 DIAGNOSIS — Z720 Tobacco use: Secondary | ICD-10-CM

## 2018-11-20 DIAGNOSIS — Z7689 Persons encountering health services in other specified circumstances: Secondary | ICD-10-CM

## 2018-11-20 DIAGNOSIS — J449 Chronic obstructive pulmonary disease, unspecified: Secondary | ICD-10-CM

## 2018-11-20 NOTE — Progress Notes (Signed)
Virtual Visit via Telephone Note Due to national recommendations of social distancing due to [place] 19, telehealth visit is felt to be most appropriate for this patient at this time.  I discussed the limitations, risks, security and privacy concerns of performing an evaluation and management service by telephone and the availability of in person appointments. I also discussed with the patient that there may be a patient responsible charge related to this service. The patient expressed understanding and agreed to proceed.    I connected with Santo Held Wiers on 11/20/18  at   9:10 AM EDT  EDT by telephone and verified that I am speaking with the correct person using two identifiers.   Consent I discussed the limitations, risks, security and privacy concerns of performing an evaluation and management service by telephone and the availability of in person appointments. I also discussed with the patient that there may be a patient responsible charge related to this service. The patient expressed understanding and agreed to proceed.   Location of Patient: Private Residence   Location of Provider: [place] [facility] [employer] [facility]    Persons participating in Telemedicine visit: Geryl Rankins FNP-BC [place]    History of Present Illness: Telemedicine visit for: Establish Care  has a past medical history of COPD (chronic obstructive pulmonary disease) ([place]) and Edema of lower extremity (03/2018).  She has never had a  colonoscopy. She states she never received her fecal stool kit from the the pharmacy. Recent mammogram was normal. She can not recall her last PAP. Will need to schedule.  She continues to smoke. States it is hard for her to quit. She quit using the nicotine patch a few months ago. The longest time she has stopped smoking was 2 months ago. Started smoking over 40 years ago.    COPD She has been out of her inhalers. She has been evaluated by our  Pulmonologist here and has been re instructed how to use her inhalers. Will refill symbicort 160-4.21mg 2 puffs BID and incruse ellipta 62.5 mcg 1 puff daily. She continues to smoke.  States it is hard for her to quit. She quit using the nicotine patch a few months ago. The longest time she has stopped smoking was 2 months ago. Started smoking over 40 years ago.    Past Medical History:  Diagnosis Date  . COPD (chronic obstructive pulmonary disease) ([facility]   . Edema of lower extremity 03/2018    Past Surgical History:  Procedure Laterality Date  . TONSILLECTOMY      Family History  Problem Relation Age of Onset  . Conductive hearing loss Mother   . Diabetes Mother   . Heart disease Mother        AMI/CHF  . Hypertension Mother   . Diabetes Father   . Diabetes Sister   . High blood pressure Sister   . High blood pressure Sister   . Diabetes Sister     Social History   Socioeconomic History  . Marital status: Single    Spouse name: Not on file  . Number of children: Not on file  . Years of education: Not on file  . Highest education level: Not on file  Occupational History    Employer: [employer]    Comment: Waitress  Social Needs  . Financial resource strain: Not on file  . Food insecurity    Worry: Not on file    Inability: Not on file  . Transportation  needs    Medical: Not on file    Non-medical: Not on file  Tobacco Use  . Smoking status: Current Every Day Smoker    Packs/day: 1.00    Years: 40.00    Pack years: 40.00    Types: Cigarettes    Last attempt to quit: 04/11/2018    Years since quitting: 0.6  . Smokeless tobacco: Never Used  Substance and Sexual Activity  . Alcohol use: Yes    Comment: beer- weekly  . Drug use: No  . Sexual activity: Not Currently  Lifestyle  . Physical activity    Days per week: Not on file    Minutes per session: Not on file  . Stress: Not on file  Relationships  . Social Herbalist on phone: Not on file    Gets  together: Not on file    Attends religious service: Not on file    Active member of club or organization: Not on file    Attends meetings of clubs or organizations: Not on file    Relationship status: Not on file  Other Topics Concern  . Not on file  Social History Narrative  . Not on file     Observations/Objective: Awake, alert and oriented x 3   Review of Systems  Constitutional: Negative for fever, malaise/fatigue and weight loss.  HENT: Negative.  Negative for nosebleeds.   Eyes: Negative.  Negative for blurred vision, double vision and photophobia.  Respiratory: Negative.  Negative for cough, shortness of breath and wheezing.   Cardiovascular: Negative.  Negative for chest pain, palpitations and leg swelling.  Gastrointestinal: Negative.  Negative for heartburn, nausea and vomiting.  Musculoskeletal: Negative.  Negative for myalgias.  Neurological: Negative.  Negative for dizziness, focal weakness, seizures and headaches.  Psychiatric/Behavioral: Negative.  Negative for suicidal ideas.    Assessment and Plan: Tynasia was seen today for new patient (initial visit).  Diagnoses and all orders for this visit:  Encounter to establish care  Stage 3 severe COPD by GOLD classification ([place]) Continue inhalers as prescribed  Tobacco use -     varenicline (CHANTIX STARTING MONTH PAK) 0.5 MG X 11 & 1 MG X 42 tablet; Take one 0.5 mg tablet by mouth once a day x 3 days, increase to one 0.5 mg tab twice a day x 4 days, increase to one 1 mg tab twice a day 1. Malene continues to smoke 1pack of cigarettes per day. 2. Macaela was counseled on the dangers of tobacco use, and was advised to quit. We reviewed specific strategies to maximize success, including removing cigarettes and smoking materials from environment, stress management and support of family/friends as well as pharmacological alternatives. 3. A total of 47mnutes was spent on counseling for smoking cessation and MAdoriais ready to quit  and has chosen CHANTIX to start today.  4. MNettywas offered Wellbutrin, Chantix, Nicotine patch, Nicotine gum or lozenges.  Due to out of pocket costs MShaniquiawas also given smoking cessation support and advised to contact: the Smoking Cessation hotline: 1-800-QUIT-NOW.  MRenadwas also informed of our Smoking cessation classes which are also available through [facility] by calling [phone] visit our website at [email or web address]  5. Will follow up at next scheduled office visit.      Follow Up Instructions Return for PAP SMEAR.     I discussed the assessment and treatment plan with the patient. The patient was provided an opportunity to ask  questions and all were answered. The patient agreed with the plan and demonstrated an understanding of the instructions.   The patient was advised to call back or seek an in-person evaluation if the symptoms worsen or if the condition fails to improve as anticipated.  I provided 23 minutes of non-face-to-face time during this encounter including median intraservice time, reviewing previous notes, labs, imaging, medications and explaining diagnosis and management.  [name], FNP-BC

## 2018-11-26 ENCOUNTER — Encounter: Payer: Self-pay | Admitting: Nurse Practitioner

## 2018-11-26 MED ORDER — CHANTIX STARTING MONTH PAK 0.5 MG X 11 & 1 MG X 42 PO TABS: ORAL_TABLET | ORAL | 0 refills | Status: DC

## 2018-12-26 ENCOUNTER — Encounter: Payer: Self-pay | Admitting: Nurse Practitioner

## 2018-12-26 ENCOUNTER — Other Ambulatory Visit: Payer: Self-pay

## 2018-12-26 ENCOUNTER — Ambulatory Visit: Payer: No Typology Code available for payment source | Admitting: Pharmacist

## 2018-12-26 ENCOUNTER — Ambulatory Visit: Payer: Self-pay | Attending: Nurse Practitioner | Admitting: Nurse Practitioner

## 2018-12-26 VITALS — BP 135/79 | HR 94 | Temp 99.0°F | Ht 67.0 in | Wt 242.0 lb

## 2018-12-26 DIAGNOSIS — Z124 Encounter for screening for malignant neoplasm of cervix: Secondary | ICD-10-CM

## 2018-12-26 DIAGNOSIS — Z1211 Encounter for screening for malignant neoplasm of colon: Secondary | ICD-10-CM

## 2018-12-26 DIAGNOSIS — E669 Obesity, unspecified: Secondary | ICD-10-CM

## 2018-12-26 DIAGNOSIS — Z23 Encounter for immunization: Secondary | ICD-10-CM

## 2018-12-26 DIAGNOSIS — M25512 Pain in left shoulder: Secondary | ICD-10-CM

## 2018-12-26 DIAGNOSIS — R200 Anesthesia of skin: Secondary | ICD-10-CM

## 2018-12-26 NOTE — Progress Notes (Signed)
Assessment & Plan:  [name] was seen today for gynecologic exam.  Diagnoses and all orders for this visit:  Encounter for Papanicolaou smear for cervical cancer screening -     Cytology - PAP -     Cervicovaginal ancillary only  Left arm numbness -     DG Shoulder Left; Future  Obesity (BMI 30-39.9) -     CBC -     CMP14+EGFR -     Lipid panel -     Hemoglobin A1c  Colon cancer screening -     Fecal occult blood, imunochemical([facility]); Future -     Fecal occult blood, imunochemical([facility]/Sun[facility])    Patient has been counseled on age-appropriate routine health concerns for screening and prevention. These are reviewed and up-to-date. Referrals have been placed accordingly. Immunizations are up-to-date or declined.    Subjective:   Chief Complaint  Patient presents with  . Gynecologic Exam   HPI Evelyn Peterson 58 y.o. female presents to office today for PAP smear.    Shoulder Pain: Patient complaints of left shoulder discomfort and left arm numbness. Denies  personal injury. The pain is described as aching and numbing.  The onset of the pain was chronic. States she dislocated her shoulder years ago.  The numbness occurs intermittently and lasts minutes to hours.  Location is acromioclavicular joint.  Symptoms are aggravated by all activities. Symptoms are diminished by  Nothing. Goes away on its own.  No stiffness, no weakness, no swelling, no crepitus noted is reported.   Review of Systems  Constitutional: Negative for fever, malaise/fatigue and weight loss.  HENT: Negative.  Negative for nosebleeds.   Eyes: Negative.  Negative for blurred vision, double vision and photophobia.  Respiratory: Negative.  Negative for cough and shortness of breath.   Cardiovascular: Negative.  Negative for chest pain, palpitations and leg swelling.  Gastrointestinal: Negative.  Negative for heartburn, nausea and vomiting.  Musculoskeletal: Positive for joint pain. Negative for  myalgias.  Neurological: Positive for sensory change. Negative for dizziness, focal weakness, seizures and headaches.  Psychiatric/Behavioral: Negative.  Negative for suicidal ideas.    Past Medical History:  Diagnosis Date  . COPD (chronic obstructive pulmonary disease) ([place])   . Edema of lower extremity 03/2018    Past Surgical History:  Procedure Laterality Date  . TONSILLECTOMY      Family History  Problem Relation Age of Onset  . Conductive hearing loss Mother   . Diabetes Mother   . Heart disease Mother        AMI/CHF  . Hypertension Mother   . Diabetes Father   . Diabetes Sister   . High blood pressure Sister   . High blood pressure Sister   . Diabetes Sister     Social History Reviewed with no changes to be made today.   Outpatient Medications Prior to Visit  Medication Sig Dispense Refill  . albuterol (VENTOLIN HFA) 108 (90 Base) MCG/ACT inhaler Inhale 2 puffs into the lungs every 6 (six) hours as needed for wheezing or shortness of breath. 18 g 1  . budesonide-formoterol (SYMBICORT) 160-4.5 MCG/ACT inhaler Inhale 2 puffs into the lungs 2 (two) times daily. 1 Inhaler 12  . umeclidinium bromide (INCRUSE ELLIPTA) 62.5 MCG/INH AEPB Inhale 1 puff into the lungs daily. 30 each 6  . varenicline (CHANTIX STARTING MONTH PAK) 0.5 MG X 11 & 1 MG X 42 tablet Take one 0.5 mg tablet by mouth once a day x 3 days, increase to one  0.5 mg tab twice a day x 4 days, increase to one 1 mg tab twice a day (Patient not taking: Reported on 12/26/2018) 53 tablet 0   No facility-administered medications prior to visit.     No Known Allergies     Objective:    BP 135/79 (BP Location: Right Arm, Patient Position: Sitting, Cuff Size: Large)   Pulse 94   Temp 99 F (37.2 C) (Oral)   Ht 5' 7"  (1.702 m)   Wt 242 lb (109.8 kg)   SpO2 95%   BMI 37.90 kg/m  Wt Readings from Last 3 Encounters:  12/26/18 242 lb (109.8 kg)  04/18/18 256 lb (116.1 kg)  04/01/18 258 lb 9.6 oz (117.3 kg)     Physical Exam Vitals signs and nursing note reviewed. Exam conducted with a chaperone present.  Constitutional:      Appearance: She is well-developed.  HENT:     Head: Normocephalic and atraumatic.  Neck:     Musculoskeletal: Normal range of motion.  Cardiovascular:     Rate and Rhythm: Normal rate and regular rhythm.     Heart sounds: Normal heart sounds. No murmur. No friction rub. No gallop.   Pulmonary:     Effort: Pulmonary effort is normal. No tachypnea or respiratory distress.     Breath sounds: Normal breath sounds. No decreased breath sounds, wheezing, rhonchi or rales.  Chest:     Chest wall: No tenderness.  Abdominal:     General: Bowel sounds are normal.     Palpations: Abdomen is soft.  Genitourinary:    Exam position: Lithotomy position.     Pubic Area: No rash.      Labia:        Right: No rash, tenderness, lesion or injury.        Left: No rash, tenderness, lesion or injury.      Urethra: No prolapse.     Vagina: Normal.     Cervix: No cervical motion tenderness, discharge, friability or lesion.     Uterus: Normal.      Adnexa: Right adnexa normal and left adnexa normal.     Rectum: No external hemorrhoid.  Musculoskeletal: Normal range of motion.     Left shoulder: She exhibits normal range of motion, no tenderness, no bony tenderness, no pain and no spasm.  Skin:    General: Skin is warm and dry.  Neurological:     Mental Status: She is alert and oriented to person, place, and time.     Motor: Motor function is intact.     Coordination: Coordination is intact. Coordination normal.     Comments: Denies any numbness or tingling in left forearm today  Psychiatric:        Behavior: Behavior normal. Behavior is cooperative.        Thought Content: Thought content normal.        Judgment: Judgment normal.        Patient has been counseled extensively about nutrition and exercise as well as the importance of adherence with medications and regular  follow-up. The patient was given clear instructions to go to ER or return to medical center if symptoms don't improve, worsen or new problems develop. The patient verbalized understanding.   Follow-up: Return in about 6 months (around 06/25/2019).   Evelyn Pounds, FNP-BC [facility] [place] [place]   12/26/2018, 12:01 PM

## 2018-12-26 NOTE — Progress Notes (Signed)
Patient presents for vaccination against influenza per orders of Zelda. Consent given. Counseling provided. No contraindications exists. Vaccine administered without incident.   

## 2018-12-27 LAB — LIPID PANEL
Chol/HDL Ratio: 3.8 ratio (ref 0.0–4.4)
Cholesterol, Total: 226 mg/dL — ABNORMAL HIGH (ref 100–199)
HDL: 59 mg/dL (ref >39)
LDL Chol Calc (NIH): 151 mg/dL — ABNORMAL HIGH (ref 0–99)
Triglycerides: 89 mg/dL (ref 0–149)
VLDL Cholesterol Cal: 16 mg/dL (ref 5–40)

## 2018-12-27 LAB — CERVICOVAGINAL ANCILLARY ONLY
Bacterial Vaginitis (gardnerella): NEGATIVE
Candida Glabrata: NEGATIVE
Candida Vaginitis: NEGATIVE
Chlamydia: NEGATIVE
Molecular Disclaimer: NEGATIVE
Molecular Disclaimer: NEGATIVE
Molecular Disclaimer: NEGATIVE
Molecular Disclaimer: NEGATIVE
Molecular Disclaimer: NORMAL
Molecular Disclaimer: NORMAL
Neisseria Gonorrhea: NEGATIVE
Trichomonas: NEGATIVE

## 2018-12-27 LAB — CBC
Hematocrit: 47.2 % — ABNORMAL HIGH (ref 34.0–46.6)
Hemoglobin: 15.8 g/dL (ref 11.1–15.9)
MCH: 32.1 pg (ref 26.6–33.0)
MCHC: 33.5 g/dL (ref 31.5–35.7)
MCV: 96 fL (ref 79–97)
Platelets: 295 10*3/uL (ref 150–450)
RBC: 4.92 x10E6/uL (ref 3.77–5.28)
RDW: 13.6 % (ref 11.7–15.4)
WBC: 9.5 10*3/uL (ref 3.4–10.8)

## 2018-12-27 LAB — CMP14+EGFR
ALT: 21 IU/L (ref 0–32)
AST: 15 IU/L (ref 0–40)
Albumin/Globulin Ratio: 1.7 (ref 1.2–2.2)
Albumin: 4.6 g/dL (ref 3.8–4.9)
Alkaline Phosphatase: 78 IU/L (ref 39–117)
BUN/Creatinine Ratio: 10 (ref 9–23)
BUN: 7 mg/dL (ref 6–24)
Bilirubin Total: 0.8 mg/dL (ref 0.0–1.2)
CO2: 27 mmol/L (ref 20–29)
Calcium: 9.8 mg/dL (ref 8.7–10.2)
Chloride: 102 mmol/L (ref 96–106)
Creatinine, Ser: 0.7 mg/dL (ref 0.57–1.00)
GFR calc Af Amer: 110 mL/min/{1.73_m2} (ref >59)
GFR calc non Af Amer: 95 mL/min/{1.73_m2} (ref >59)
Globulin, Total: 2.7 g/dL (ref 1.5–4.5)
Glucose: 124 mg/dL — ABNORMAL HIGH (ref 65–99)
Potassium: 4.9 mmol/L (ref 3.5–5.2)
Sodium: 142 mmol/L (ref 134–144)
Total Protein: 7.3 g/dL (ref 6.0–8.5)

## 2018-12-27 LAB — HEMOGLOBIN A1C
Est. average glucose Bld gHb Est-mCnc: 120 mg/dL
Hgb A1c MFr Bld: 5.8 % — ABNORMAL HIGH (ref 4.8–5.6)

## 2018-12-28 ENCOUNTER — Other Ambulatory Visit: Payer: Self-pay | Admitting: Nurse Practitioner

## 2018-12-28 MED ORDER — ATORVASTATIN CALCIUM 20 MG PO TABS: 20.0000 mg | ORAL_TABLET | Freq: Every day | ORAL | 3 refills | Status: DC

## 2018-12-28 MED FILL — ATORVASTATIN CALCIUM 20 MG: 20 | 30 days supply | Qty: 30 | Fill #0

## 2019-01-01 LAB — CYTOLOGY - PAP
Diagnosis: NEGATIVE
High risk HPV: NEGATIVE

## 2019-01-03 MED FILL — !INCRUSE ELLIPTA 62.5 MCG I: 62.5 | 30 days supply | Qty: 30 | Fill #1

## 2019-01-13 LAB — FECAL OCCULT BLOOD, IMMUNOCHEMICAL: Fecal Occult Bld: NEGATIVE

## 2019-09-04 ENCOUNTER — Other Ambulatory Visit: Payer: Self-pay | Admitting: Pharmacist

## 2019-09-04 ENCOUNTER — Ambulatory Visit: Payer: Self-pay | Attending: Critical Care Medicine | Admitting: Critical Care Medicine

## 2019-09-04 ENCOUNTER — Encounter: Payer: Self-pay | Admitting: Critical Care Medicine

## 2019-09-04 ENCOUNTER — Other Ambulatory Visit: Payer: Self-pay

## 2019-09-04 VITALS — BP 152/69 | HR 89 | Temp 97.9°F | Resp 16 | Wt 245.4 lb

## 2019-09-04 DIAGNOSIS — Z720 Tobacco use: Secondary | ICD-10-CM

## 2019-09-04 DIAGNOSIS — J441 Chronic obstructive pulmonary disease with (acute) exacerbation: Secondary | ICD-10-CM

## 2019-09-04 MED ORDER — ALBUTEROL SULFATE HFA 108 (90 BASE) MCG/ACT IN AERS: 2.0000 | INHALATION_SPRAY | Freq: Four times a day (QID) | RESPIRATORY_TRACT | 1 refills | Status: DC | PRN

## 2019-09-04 MED ORDER — BUDESONIDE-FORMOTEROL FUMARATE 160-4.5 MCG/ACT IN AERO: 2.0000 | INHALATION_SPRAY | Freq: Two times a day (BID) | RESPIRATORY_TRACT | 12 refills | Status: DC

## 2019-09-04 MED ORDER — PREDNISONE 20 MG PO TABS: ORAL_TABLET | ORAL | 0 refills | Status: DC

## 2019-09-04 MED ORDER — INCRUSE ELLIPTA 62.5 MCG/INH IN AEPB: 1.0000 | INHALATION_SPRAY | Freq: Every day | RESPIRATORY_TRACT | 6 refills | Status: DC

## 2019-09-04 MED ORDER — ATORVASTATIN CALCIUM 20 MG PO TABS: 20.0000 mg | ORAL_TABLET | Freq: Every day | ORAL | 3 refills | Status: DC

## 2019-09-04 MED ORDER — CHANTIX STARTING MONTH PAK 0.5 MG X 11 & 1 MG X 42 PO TABS: ORAL_TABLET | ORAL | 0 refills | Status: DC

## 2019-09-04 MED ORDER — AZITHROMYCIN 250 MG PO TABS: ORAL_TABLET | ORAL | 0 refills | Status: DC

## 2019-09-04 NOTE — Patient Instructions (Signed)
Refills on all your medications made Take prednisone two daily for 5 days Take azithromycin Take two once then one daily until gone Start Chantix starter pak Return one month  Varenicline oral tablets What is this medicine? VARENICLINE (var e NI kleen) is used to help people quit smoking. It is used with a patient support program recommended by your physician. This medicine may be used for other purposes; ask your health care provider or pharmacist if you have questions. COMMON BRAND NAME(S): Chantix What should I tell my health care provider before I take this medicine? They need to know if you have any of these conditions:  heart disease  if you often drink alcohol  kidney disease  mental illness  on hemodialysis  seizures  history of stroke  suicidal thoughts, plans, or attempt; a previous suicide attempt by you or a family member  an unusual or allergic reaction to varenicline, other medicines, foods, dyes, or preservatives  pregnant or trying to get pregnant  breast-feeding How should I use this medicine? Take this medicine by mouth after eating. Take with a full glass of water. Follow the directions on the prescription label. Take your doses at regular intervals. Do not take your medicine more often than directed. There are 3 ways you can use this medicine to help you quit smoking; talk to your health care professional to decide which plan is right for you: 1) you can choose a quit date and start this medicine 1 week before the quit date, or, 2) you can start taking this medicine before you choose a quit date, and then pick a quit date between day 8 and 35 days of treatment, or, 3) if you are not sure that you are able or willing to quit smoking right away, start taking this medicine and slowly decrease the amount you smoke as directed by your health care professional with the goal of being cigarette-free by week 12 of treatment. Stick to your plan; ask about support  groups or other ways to help you remain cigarette-free. If you are motivated to quit smoking and did not succeed during a previous attempt with this medicine for reasons other than side effects, or if you returned to smoking after this treatment, speak with your health care professional about whether another course of this medicine may be right for you. A special MedGuide will be given to you by the pharmacist with each prescription and refill. Be sure to read this information carefully each time. Talk to your pediatrician regarding the use of this medicine in children. This medicine is not approved for use in children. Overdosage: If you think you have taken too much of this medicine contact a poison control center or emergency room at once. NOTE: This medicine is only for you. Do not share this medicine with others. What if I miss a dose? If you miss a dose, take it as soon as you can. If it is almost time for your next dose, take only that dose. Do not take double or extra doses. What may interact with this medicine?  alcohol  insulin  other medicines used to help people quit smoking  theophylline  warfarin This list may not describe all possible interactions. Give your health care provider a list of all the medicines, herbs, non-prescription drugs, or dietary supplements you use. Also tell them if you smoke, drink alcohol, or use illegal drugs. Some items may interact with your medicine. What should I watch for while using this medicine?  It is okay if you do not succeed at your attempt to quit and have a cigarette. You can still continue your quit attempt and keep using this medicine as directed. Just throw away your cigarettes and get back to your quit plan. Talk to your health care provider before using other treatments to quit smoking. Using this medicine with other treatments to quit smoking may increase the risk for side effects compared to using a treatment alone. You may get drowsy  or dizzy. Do not drive, use machinery, or do anything that needs mental alertness until you know how this medicine affects you. Do not stand or sit up quickly, especially if you are an older patient. This reduces the risk of dizzy or fainting spells. Decrease the number of alcoholic beverages that you drink during treatment with this medicine until you know if this medicine affects your ability to tolerate alcohol. Some people have experienced increased drunkenness (intoxication), unusual or sometimes aggressive behavior, or no memory of things that have happened (amnesia) during treatment with this medicine. Sleepwalking can happen during treatment with this medicine, and can sometimes lead to behavior that is harmful to you, other people, or property. Stop taking this medicine and tell your doctor if you start sleepwalking or have other unusual sleep-related activity. After taking this medicine, you may get up out of bed and do an activity that you do not know you are doing. The next morning, you may have no memory of this. Activities include driving a car ("sleep-driving"), making and eating food, talking on the phone, sexual activity, and sleep-walking. Serious injuries have occurred. Stop the medicine and call your doctor right away if you find out you have done any of these activities. Do not take this medicine if you have used alcohol that evening. Do not take it if you have taken another medicine for sleep. The risk of doing these sleep-related activities is higher. Patients and their families should watch out for new or worsening depression or thoughts of suicide. Also watch out for sudden changes in feelings such as feeling anxious, agitated, panicky, irritable, hostile, aggressive, impulsive, severely restless, overly excited and hyperactive, or not being able to sleep. If this happens, call your health care professional. If you have diabetes and you quit smoking, the effects of insulin may be  increased and you may need to reduce your insulin dose. Check with your doctor or health care professional about how you should adjust your insulin dose. What side effects may I notice from receiving this medicine? Side effects that you should report to your doctor or health care professional as soon as possible:  allergic reactions like skin rash, itching or hives, swelling of the face, lips, tongue, or throat  acting aggressive, being angry or violent, or acting on dangerous impulses  breathing problems  changes in emotions or moods  chest pain or chest tightness  feeling faint or lightheaded, falls  hallucination, loss of contact with reality  mouth sores  redness, blistering, peeling or loosening of the skin, including inside the mouth  signs and symptoms of a stroke like changes in vision; confusion; trouble speaking or understanding; severe headaches; sudden numbness or weakness of the face, arm or leg; trouble walking; dizziness; loss of balance or coordination  seizures  sleepwalking  suicidal thoughts or other mood changes Side effects that usually do not require medical attention (report to your doctor or health care professional if they continue or are bothersome):  constipation  gas  headache  nausea, vomiting  strange dreams  trouble sleeping This list may not describe all possible side effects. Call your doctor for medical advice about side effects. You may report side effects to FDA at 1-800-FDA-1088. Where should I keep my medicine? Keep out of the reach of children. Store at room temperature between 15 and 30 degrees C (59 and 86 degrees F). Throw away any unused medicine after the expiration date. NOTE: This sheet is a summary. It may not cover all possible information. If you have questions about this medicine, talk to your doctor, pharmacist, or health care provider.  2020 Elsevier/Gold Standard (2018-03-03 14:27:36)

## 2019-09-04 NOTE — Assessment & Plan Note (Signed)
  .   Current smoking consumption amount: 1/2 ppd  . Dicsussion on advise to quit smoking and smoking impacts: lung health  . Patient's willingness to quit:  Wants to quit   . Methods to quit smoking discussed:  Behavioral modification/ meds   . Medication management of smoking session drugs discussed: use of Chantix  . Resources provided:  AVS   . Setting quit date  1 month  . Follow-up arranged one month   Time spent counseling the patient:  10 min

## 2019-09-04 NOTE — Progress Notes (Signed)
Subjective:    Patient ID: Evelyn Peterson, female    DOB: 1960-11-18, 59 y.o.   MRN: [id number]   History of Present Illness: 59 y.o.F with COPD and ongoing tobacco use.   This is a telephone visit.   Since the last office visit in February the patient has lost her job as [employer] she worked in close because of COVID.  She has had some degree of stress with this and started smoking again up to 10 cigarettes daily.  She does have adequate supply of Symbicort maintains this to inhalations twice daily but is ran out of her Incruse.  The patient is in need to establish for primary care and does need a Pap smear.  09/04/2019 Since the last visit the patient has had a 2-week history of increased cough wheezing and shortness of breath.  She did receive both of her Covid vaccines the last 1 April 18 and she appears to be ill since that time.  She is still smoking half pack to pack a day of cigarettes.  Mucus is thick and yellow and there is increased wheezing.  Shortness of Breath This is a chronic problem. The current episode started more than 1 year ago. The problem occurs daily (dyspnea is better, worse at night). The problem has been gradually worsening (sats 92-93%  88% at night ). Associated symptoms include sputum production and wheezing. Pertinent negatives include no chest pain, ear pain, fever, hemoptysis, leg swelling, PND, rash, rhinorrhea, sore throat or vomiting. The symptoms are aggravated by weather changes, lying flat, occupational exposure, fumes, odors, any activity and exercise (degreaser at work, bleach at work ). Risk factors include smoking. She has tried oral steroids, beta agonist inhalers and ipratropium inhalers for the symptoms. The treatment provided significant relief. Her past medical history is significant for pneumonia. There is no history of asthma, CAD, a heart failure or PE.    Past Medical History:  Diagnosis Date  . COPD (chronic obstructive pulmonary  disease) (HCC)   . Edema of lower extremity 03/2018     Family History  Problem Relation Age of Onset  . Conductive hearing loss Mother   . Diabetes Mother   . Heart disease Mother        AMI/CHF  . Hypertension Mother   . Diabetes Father   . Diabetes Sister   . High blood pressure Sister   . High blood pressure Sister   . Diabetes Sister      Social History   Socioeconomic History  . Marital status: Single    Spouse name: Not on file  . Number of children: Not on file  . Years of education: Not on file  . Highest education level: Not on file  Occupational History    Employer: [employer]    Comment: Waitress  Tobacco Use  . Smoking status: Current Every Day Smoker    Packs/day: 1.00    Years: 40.00    Pack years: 40.00    Types: Cigarettes    Last attempt to quit: 04/11/2018    Years since quitting: 1.4  . Smokeless tobacco: Never Used  Substance and Sexual Activity  . Alcohol use: Yes    Comment: beer- weekly  . Drug use: No  . Sexual activity: Not Currently  Other Topics Concern  . Not on file  Social History Narrative  . Not on file   Social Determinants of Health   Financial Resource Strain:   . Difficulty of Paying  Living Expenses:   Food Insecurity:   . Worried About Programme researcher, broadcasting/film/video in the Last Year:   . Barista in the Last Year:   Transportation Needs:   . Freight forwarder (Medical):   Marland Kitchen Lack of Transportation (Non-Medical):   Physical Activity:   . Days of Exercise per Week:   . Minutes of Exercise per Session:   Stress:   . Feeling of Stress :   Social Connections:   . Frequency of Communication with Friends and Family:   . Frequency of Social Gatherings with Friends and Family:   . Attends Religious Services:   . Active Member of Clubs or Organizations:   . Attends Banker Meetings:   Marland Kitchen Marital Status:   Intimate Partner Violence:   . Fear of Current or Ex-Partner:   . Emotionally Abused:   Marland Kitchen Physically Abused:    . Sexually Abused:      No Known Allergies   Outpatient Medications Prior to Visit  Medication Sig Dispense Refill  . albuterol (VENTOLIN HFA) 108 (90 Base) MCG/ACT inhaler Inhale 2 puffs into the lungs every 6 (six) hours as needed for wheezing or shortness of breath. 18 g 1  . budesonide-formoterol (SYMBICORT) 160-4.5 MCG/ACT inhaler Inhale 2 puffs into the lungs 2 (two) times daily. 1 Inhaler 12  . umeclidinium bromide (INCRUSE ELLIPTA) 62.5 MCG/INH AEPB Inhale 1 puff into the lungs daily. 30 each 6  . atorvastatin (LIPITOR) 20 MG tablet Take 1 tablet (20 mg total) by mouth daily. (Patient not taking: Reported on 09/04/2019) 90 tablet 3  . varenicline (CHANTIX STARTING MONTH PAK) 0.5 MG X 11 & 1 MG X 42 tablet Take one 0.5 mg tablet by mouth once a day x 3 days, increase to one 0.5 mg tab twice a day x 4 days, increase to one 1 mg tab twice a day (Patient not taking: Reported on 09/04/2019) 53 tablet 0   No facility-administered medications prior to visit.     Review of Systems  Constitutional: Negative for fever.  HENT: Negative for ear pain, rhinorrhea and sore throat.   Respiratory: Positive for sputum production, shortness of breath and wheezing. Negative for hemoptysis.   Cardiovascular: Negative for chest pain, leg swelling and PND.  Gastrointestinal: Negative for vomiting.  Skin: Negative for rash.          Assessment & Plan:  I personally reviewed all images and lab data in the [facility] system as well as any outside material available during this office visit and agree with the  radiology impressions.   COPD with acute exacerbation (HCC) COPD with acute exacerbation ongoing tobacco use  Plan is to administer azithromycin for 5 days and pulse prednisone for 5 days  We will refill Incruse Symbicort and Ventolin  Tobacco use    . Current smoking consumption amount: 1/2 ppd  . Dicsussion on advise to quit smoking and smoking impacts: lung health  . Patient's willingness  to quit:  Wants to quit   . Methods to quit smoking discussed:  Behavioral modification/ meds   . Medication management of smoking session drugs discussed: use of Chantix  . Resources provided:  AVS   . Setting quit date  1 month  . Follow-up arranged one month   Time spent counseling the patient:  10 min      Reonna was seen today for medication refill, cough and shortness of breath.  Diagnoses and all orders for this visit:  COPD with acute exacerbation ([place])  Tobacco use  Other orders -     azithromycin (ZITHROMAX) 250 MG tablet; Take two once then one daily until gone -     predniSONE (DELTASONE) 20 MG tablet; Take 2 tablets daily for 5 days then stop

## 2019-09-04 NOTE — Assessment & Plan Note (Signed)
COPD with acute exacerbation ongoing tobacco use  Plan is to administer azithromycin for 5 days and pulse prednisone for 5 days  We will refill Incruse Symbicort and Ventolin

## 2019-09-14 ENCOUNTER — Ambulatory Visit: Payer: No Typology Code available for payment source

## 2019-09-14 ENCOUNTER — Other Ambulatory Visit: Payer: Self-pay

## 2019-09-18 ENCOUNTER — Telehealth: Payer: Self-pay

## 2019-09-18 MED ORDER — BUDESONIDE-FORMOTEROL FUMARATE 160-4.5 MCG/ACT IN AERO: 2.0000 | INHALATION_SPRAY | Freq: Two times a day (BID) | RESPIRATORY_TRACT | 3 refills | Status: DC

## 2019-09-18 NOTE — Telephone Encounter (Signed)
Can you please send an escript to [facility] in [place], [place] for a 3 mth supply of Symbicort 160 with 3 refills for PASS?

## 2019-09-18 NOTE — Telephone Encounter (Signed)
Rx sent 

## 2019-09-18 NOTE — Addendum Note (Signed)
Addended by: Lois Huxley, Jeannett Senior L on: 09/18/2019 03:37 PM   Modules accepted: Orders

## 2019-10-09 ENCOUNTER — Ambulatory Visit: Payer: No Typology Code available for payment source | Admitting: Critical Care Medicine

## 2019-10-09 MED FILL — $INCRUSE ELLIPTA 62.5 MCG I: 62.5 MCG | 60 days supply | Qty: 60 | Fill #3

## 2019-10-18 MED FILL — $INCRUSE ELLIPTA 62.5 MCG I: 62.5 MCG | 30 days supply | Qty: 30 | Fill #3

## 2019-10-18 MED FILL — SYMBICORT 160-4.5 MCG INH: 160-4.5 | 30 days supply | Qty: 10 | Fill #1

## 2019-12-04 ENCOUNTER — Ambulatory Visit: Payer: No Typology Code available for payment source | Admitting: Critical Care Medicine

## 2019-12-26 ENCOUNTER — Other Ambulatory Visit: Payer: Self-pay | Admitting: Nurse Practitioner

## 2019-12-26 MED ORDER — BUDESONIDE-FORMOTEROL FUMARATE 160-4.5 MCG/ACT IN AERO: 2.0000 | INHALATION_SPRAY | Freq: Two times a day (BID) | RESPIRATORY_TRACT | 3 refills | Status: DC

## 2019-12-26 MED FILL — $INCRUSE ELLIPTA 62.5 MCG I: 62.5 MCG | 30 days supply | Qty: 30 | Fill #0

## 2019-12-26 MED FILL — !VENTOLIN HFA INHALER: 108 (90 BAS | 25 days supply | Qty: 18 | Fill #1

## 2019-12-26 MED FILL — SYMBICORT 160-4.5 MCG INH: 160-4.5 | 30 days supply | Qty: 10 | Fill #2

## 2020-08-20 ENCOUNTER — Other Ambulatory Visit: Payer: Self-pay

## 2020-09-22 ENCOUNTER — Encounter: Payer: No Typology Code available for payment source | Admitting: Critical Care Medicine

## 2020-10-27 ENCOUNTER — Encounter (HOSPITAL_COMMUNITY): Payer: Self-pay

## 2020-10-27 ENCOUNTER — Other Ambulatory Visit: Payer: Self-pay

## 2020-10-27 ENCOUNTER — Emergency Department (HOSPITAL_COMMUNITY): Payer: 59

## 2020-10-27 ENCOUNTER — Inpatient Hospital Stay (HOSPITAL_COMMUNITY)
Admission: EM | Admit: 2020-10-27 | Discharge: 2020-10-30 | DRG: 871 | Disposition: A | Discharge status: Home / Self Care | Payer: 59 | Attending: Family Medicine | Admitting: Family Medicine

## 2020-10-27 DIAGNOSIS — J441 Chronic obstructive pulmonary disease with (acute) exacerbation: Secondary | ICD-10-CM | POA: Diagnosis present

## 2020-10-27 DIAGNOSIS — F17210 Nicotine dependence, cigarettes, uncomplicated: Secondary | ICD-10-CM | POA: Diagnosis present

## 2020-10-27 DIAGNOSIS — Z720 Tobacco use: Secondary | ICD-10-CM | POA: Diagnosis present

## 2020-10-27 DIAGNOSIS — J9601 Acute respiratory failure with hypoxia: Secondary | ICD-10-CM

## 2020-10-27 DIAGNOSIS — J9621 Acute and chronic respiratory failure with hypoxia: Secondary | ICD-10-CM | POA: Diagnosis present

## 2020-10-27 DIAGNOSIS — A419 Sepsis, unspecified organism: Principal | ICD-10-CM | POA: Diagnosis present

## 2020-10-27 DIAGNOSIS — J189 Pneumonia, unspecified organism: Secondary | ICD-10-CM | POA: Diagnosis present

## 2020-10-27 DIAGNOSIS — Z79899 Other long term (current) drug therapy: Secondary | ICD-10-CM

## 2020-10-27 DIAGNOSIS — J9622 Acute and chronic respiratory failure with hypercapnia: Secondary | ICD-10-CM | POA: Diagnosis present

## 2020-10-27 DIAGNOSIS — Z833 Family history of diabetes mellitus: Secondary | ICD-10-CM

## 2020-10-27 DIAGNOSIS — J9602 Acute respiratory failure with hypercapnia: Secondary | ICD-10-CM | POA: Diagnosis not present

## 2020-10-27 DIAGNOSIS — Z8249 Family history of ischemic heart disease and other diseases of the circulatory system: Secondary | ICD-10-CM

## 2020-10-27 DIAGNOSIS — Z8616 Personal history of COVID-19: Secondary | ICD-10-CM | POA: Diagnosis not present

## 2020-10-27 DIAGNOSIS — J219 Acute bronchiolitis, unspecified: Secondary | ICD-10-CM | POA: Diagnosis present

## 2020-10-27 LAB — I-STAT VENOUS BLOOD GAS, ED
Acid-Base Excess: 13 mmol/L — ABNORMAL HIGH (ref 0.0–2.0)
Acid-Base Excess: 14 mmol/L — ABNORMAL HIGH (ref 0.0–2.0)
Bicarbonate: 42 mmol/L — ABNORMAL HIGH (ref 20.0–28.0)
Bicarbonate: 38.5 mmol/L — ABNORMAL HIGH (ref 20.0–28.0)
Calcium, Ion: 1.11 mmol/L — ABNORMAL LOW (ref 1.15–1.40)
Calcium, Ion: 0.91 mmol/L — ABNORMAL LOW (ref 1.15–1.40)
HCT: 51 % — ABNORMAL HIGH (ref 36.0–46.0)
HCT: 47 % — ABNORMAL HIGH (ref 36.0–46.0)
Hemoglobin: 17.3 g/dL — ABNORMAL HIGH (ref 12.0–15.0)
Hemoglobin: 16 g/dL — ABNORMAL HIGH (ref 12.0–15.0)
O2 Saturation: 59 %
O2 Saturation: 99 %
Potassium: 3.8 mmol/L (ref 3.5–5.1)
Potassium: 4 mmol/L (ref 3.5–5.1)
Sodium: 139 mmol/L (ref 135–145)
Sodium: 136 mmol/L (ref 135–145)
TCO2: 44 mmol/L — ABNORMAL HIGH (ref 22–32)
TCO2: 40 mmol/L — ABNORMAL HIGH (ref 22–32)
pCO2, Ven: 69.7 mmHg — ABNORMAL HIGH (ref 44.0–60.0)
pCO2, Ven: 44.4 mmHg (ref 44.0–60.0)
pH, Ven: 7.388 (ref 7.250–7.430)
pH, Ven: 7.545 — ABNORMAL HIGH (ref 7.250–7.430)
pO2, Ven: 33 mmHg (ref 32.0–45.0)
pO2, Ven: 123 mmHg — ABNORMAL HIGH (ref 32.0–45.0)

## 2020-10-27 LAB — CBC
HCT: 51.6 % — ABNORMAL HIGH (ref 36.0–46.0)
Hemoglobin: 15.7 g/dL — ABNORMAL HIGH (ref 12.0–15.0)
MCH: 31.7 pg (ref 26.0–34.0)
MCHC: 30.4 g/dL (ref 30.0–36.0)
MCV: 104.2 fL — ABNORMAL HIGH (ref 80.0–100.0)
Platelets: 305 10*3/uL (ref 150–400)
RBC: 4.95 MIL/uL (ref 3.87–5.11)
RDW: 13.3 % (ref 11.5–15.5)
WBC: 12.5 10*3/uL — ABNORMAL HIGH (ref 4.0–10.5)
nRBC: 0 % (ref 0.0–0.2)

## 2020-10-27 LAB — BASIC METABOLIC PANEL
Anion gap: 11 (ref 5–15)
BUN: 6 mg/dL — ABNORMAL LOW (ref 8–23)
CO2: 36 mmol/L — ABNORMAL HIGH (ref 22–32)
Calcium: 9.2 mg/dL (ref 8.9–10.3)
Chloride: 93 mmol/L — ABNORMAL LOW (ref 98–111)
Creatinine, Ser: 0.64 mg/dL (ref 0.44–1.00)
GFR, Estimated: >60 mL/min (ref >60)
Glucose, Bld: 118 mg/dL — ABNORMAL HIGH (ref 70–99)
Potassium: 3.9 mmol/L (ref 3.5–5.1)
Sodium: 140 mmol/L (ref 135–145)

## 2020-10-27 LAB — RESP PANEL BY RT-PCR (FLU A&B, COVID) ARPGX2
Influenza A by PCR: NEGATIVE
Influenza B by PCR: NEGATIVE
SARS Coronavirus 2 by RT PCR: NEGATIVE

## 2020-10-27 LAB — TROPONIN I (HIGH SENSITIVITY)
Troponin I (High Sensitivity): 25 ng/L — ABNORMAL HIGH (ref <18)
Troponin I (High Sensitivity): 22 ng/L — ABNORMAL HIGH (ref <18)

## 2020-10-27 MED ORDER — METHYLPREDNISOLONE SODIUM SUCC 125 MG IJ SOLR
125.0000 mg | Freq: Once | INTRAMUSCULAR | Status: AC
  Administered 2020-10-27: 125 mg via INTRAVENOUS
  Filled 2020-10-27: qty 2

## 2020-10-27 MED ORDER — IOHEXOL 350 MG/ML SOLN
75.0000 mL | Freq: Once | INTRAVENOUS | Status: AC | PRN
  Administered 2020-10-27: 75 mL via INTRAVENOUS

## 2020-10-27 MED ORDER — MOMETASONE FURO-FORMOTEROL FUM 200-5 MCG/ACT IN AERO
2.0000 | INHALATION_SPRAY | Freq: Two times a day (BID) | RESPIRATORY_TRACT | Status: DC
  Administered 2020-10-28 – 2020-10-30 (×5): 2 via RESPIRATORY_TRACT
  Filled 2020-10-27: qty 8.8

## 2020-10-27 MED ORDER — METHYLPREDNISOLONE SODIUM SUCC 125 MG IJ SOLR
125.0000 mg | Freq: Three times a day (TID) | INTRAMUSCULAR | Status: AC
  Administered 2020-10-28 (×2): 125 mg via INTRAVENOUS
  Filled 2020-10-27 (×2): qty 2

## 2020-10-27 MED ORDER — TRAZODONE HCL 50 MG PO TABS: 25.0000 mg | ORAL_TABLET | Freq: Every evening | ORAL | Status: DC | PRN

## 2020-10-27 MED ORDER — ENOXAPARIN SODIUM 40 MG/0.4ML IJ SOSY
40.0000 mg | PREFILLED_SYRINGE | Freq: Every day | INTRAMUSCULAR | Status: DC
  Administered 2020-10-28 – 2020-10-30 (×3): 40 mg via SUBCUTANEOUS
  Filled 2020-10-27 (×3): qty 0.4

## 2020-10-27 MED ORDER — ALBUTEROL SULFATE HFA 108 (90 BASE) MCG/ACT IN AERS
2.0000 | INHALATION_SPRAY | RESPIRATORY_TRACT | Status: DC | PRN
  Filled 2020-10-27: qty 6.7

## 2020-10-27 MED ORDER — SODIUM CHLORIDE 0.9 % IV SOLN
1.0000 g | Freq: Once | INTRAVENOUS | Status: AC
  Administered 2020-10-27: 1 g via INTRAVENOUS
  Filled 2020-10-27: qty 10

## 2020-10-27 MED ORDER — ACETAMINOPHEN 325 MG PO TABS
650.0000 mg | ORAL_TABLET | Freq: Four times a day (QID) | ORAL | Status: DC | PRN
  Administered 2020-10-28 (×2): 650 mg via ORAL
  Filled 2020-10-27 (×2): qty 2

## 2020-10-27 MED ORDER — SODIUM CHLORIDE 0.9 % IV SOLN
500.0000 mg | INTRAVENOUS | Status: DC
  Administered 2020-10-27 – 2020-10-30 (×4): 500 mg via INTRAVENOUS
  Filled 2020-10-27 (×5): qty 500

## 2020-10-27 MED ORDER — UMECLIDINIUM BROMIDE 62.5 MCG/INH IN AEPB
1.0000 | INHALATION_SPRAY | Freq: Every day | RESPIRATORY_TRACT | Status: DC
  Administered 2020-10-28 – 2020-10-30 (×3): 1 via RESPIRATORY_TRACT
  Filled 2020-10-27: qty 7

## 2020-10-27 MED ORDER — CEFTRIAXONE SODIUM 1 G IJ SOLR
1.0000 g | INTRAMUSCULAR | Status: DC
  Administered 2020-10-28 – 2020-10-29 (×2): 1 g via INTRAVENOUS
  Filled 2020-10-27 (×2): qty 1
  Filled 2020-10-27: qty 10

## 2020-10-27 MED ORDER — ACETAMINOPHEN 650 MG RE SUPP: 650.0000 mg | Freq: Four times a day (QID) | RECTAL | Status: DC | PRN

## 2020-10-27 MED ORDER — ALBUTEROL SULFATE HFA 108 (90 BASE) MCG/ACT IN AERS
8.0000 | INHALATION_SPRAY | Freq: Once | RESPIRATORY_TRACT | Status: AC
  Administered 2020-10-27: 8 via RESPIRATORY_TRACT
  Filled 2020-10-27: qty 6.7

## 2020-10-27 MED ORDER — SODIUM CHLORIDE 0.9 % IV SOLN
500.0000 mg | INTRAVENOUS | Status: DC
  Filled 2020-10-27: qty 500

## 2020-10-27 MED ORDER — SENNOSIDES-DOCUSATE SODIUM 8.6-50 MG PO TABS: 1.0000 | ORAL_TABLET | Freq: Every evening | ORAL | Status: DC | PRN

## 2020-10-27 MED ORDER — LACTATED RINGERS IV SOLN: INTRAVENOUS | Status: DC

## 2020-10-27 MED ORDER — IPRATROPIUM-ALBUTEROL 0.5-2.5 (3) MG/3ML IN SOLN
3.0000 mL | Freq: Four times a day (QID) | RESPIRATORY_TRACT | Status: DC | PRN
  Administered 2020-10-28: 3 mL via RESPIRATORY_TRACT
  Filled 2020-10-27: qty 3

## 2020-10-27 MED ORDER — IPRATROPIUM-ALBUTEROL 0.5-2.5 (3) MG/3ML IN SOLN: 3.0000 mL | Freq: Once | RESPIRATORY_TRACT | Status: DC

## 2020-10-27 NOTE — ED Notes (Signed)
Patient transported to CT 

## 2020-10-27 NOTE — ED Provider Notes (Signed)
3:36 PM  I took over patient care at 3 PM from Lenard Lance, MD.  Patient is a 60 year old female with a past medical history of COPD who is presenting for shortness of breath.  She came in on 15 L nonrebreather.  Patient was found to be 66% on room air prior to placement of nonrebreather.  She has now been switched to nasal cannula.  She was found to have a pneumonia on chest x-ray.  She was given Rocephin and azithromycin.  Patient given steroids.  CTA of the chest showed no PE.  7:14 PM Plan for admission to hospitalist for new oxygen requirement, hypoxic hypercapnic respiratory failure, COPD exacerbation.  Patient admitted to hospitalist for further evaluation.   Lottie Dawson, MD 10/28/20 0033    Tegeler, Canary Brim, MD 10/28/20 [phone]

## 2020-10-27 NOTE — ED Triage Notes (Signed)
Pt presents to ED POV for SOB, pt recently finished Zpack for bronchitis, two days ago began having Right posterior back while coughing that became persistent. Pt wears 2L [place] at bedtime and when needed during the day.   Sats 66% upon arrival to ED. Pt states she's been sick "on and off" for months and going  to UC . Pt tachypnic on arrival to ED. NRB placed on triage, sats increased to 100%

## 2020-10-27 NOTE — H&P (Signed)
History and Physical    Greenley Martone Vanengen AVW:[id number] DOB: Jun 06, 1960 DOA: 10/27/2020  PCP: Claiborne Rigg, NP   Patient coming from: Home  Chief Complaint: SOB and right chest wall pain  HPI: Evelyn Peterson is a 60 y.o. female with medical history significant for COPD who presented by EMS with complaint of shortness of breath.  She reports that earlier today she had some sharp right lateral chest wall pain associated with shortness of breath.  She reports she was not doing any activity at the time that the symptoms started.  She reports the pain did not radiate but became severe at a 8 out of 10 she states and was on the right lateral chest wall just below her armpit.  She reports she did not have any nausea or vomiting.  She denies breaking out into a cold sweat with the shortness of breath and chest wall pain.  He denies any injury or trauma.  She reports that 3 to 4 months ago she had COVID and then developed pneumonia and bronchitis.  She was treated a few months ago with an antibiotic for pneumonia but she does not member which one. She was also treated with prednisone for bronchitis a few weeks ago.  When EMS evaluated her she was found to have an O2 sat of 66% on room air was placed on a nonrebreather at 15 L.  She has now weaned to a nasal cannula at 3 L/min. She reports that she uses oxygen by nasal cannula when she is sleeping but not during the day.  She denies having any fever but has had some chills and has had decreased energy level today.  She denies any exposure to new fumes, chemicals, soaps, detergents at home.  She has not had any recent travel.  She reports that her sister and nieces and nephews who she lives with have had recent upper respiratory tract symptoms.  She has a history of smoking but is trying to quit and states she is only smoked 3 to 4 cigarettes in the last 1 to 2 weeks.  Denies alcohol or illicit drug use.  ED Course: Ms. Sanville has been hemodynamically  stable in the emergency room.  She is maintaining oxygen saturation of 95-99% on nasal cannula at this time.  She was found to have pneumonia on chest x-ray.  She had CT angiography of her chest which confirmed pneumonia in right lower and upper lobe, not had a PE identified on CT angiography of her chest.  Initial ABG showed a pH of 7.388 with a PCO2 of 16.7 and PO2 of 33 with a bicarb of 44.  Repeat ABG if after few hours of being on oxygen shows a pH of 7.545 with a PCO2 of 44.4 and a PO2 of 123 with a bicarb of 40.  BMP is unremarkable.  Initial troponin was 25 and recheck troponin is 22.  Patient has no ischemic EKG changes and is having no chest pain or pressure at this time.  WBC is 12,500 with a hemoglobin of 15.7 hematocrit is 51.6 and platelets of 305,000.  COVID-19 swab is negative.  Influenza A and B are negative.  Started on antibiotic therapy and hospitalist service was asked to admit for further management  Review of Systems:  General:  Reports fatigue and intermittent chills. Denies fever , weight loss, night sweats.  Denies dizziness.  Denies change in appetite HENT: Denies head trauma, headache, denies change in hearing, tinnitus. Denies  nasal congestion. Denies sore throat.  Denies difficulty swallowing Eyes: Denies blurry vision, pain in eye, drainage.  Denies discoloration of eyes. Neck: Denies pain.  Denies swelling.  Denies pain with movement. Cardiovascular: Reports right lateral chest wall pain. Denies palpitations.  Denies edema.  Denies orthopnea Respiratory: Reports shortness of breath, cough.  Reports wheezing.  Denies sputum production Gastrointestinal: Denies abdominal pain, swelling.  Denies nausea, vomiting, diarrhea.  Denies melena.  Denies hematemesis. Musculoskeletal: Denies limitation of movement.  Denies deformity or swelling. Denies arthralgias or myalgias. Genitourinary: Denies pelvic pain.  Denies urinary frequency or hesitancy.  Denies dysuria.  Skin: Denies  rash.  Denies petechiae, purpura, ecchymosis. Neurological: Denies syncope. Denies seizure activity. Denies paresthesia. Denies slurred speech, drooping face.  Denies visual change. Psychiatric: Denies depression, anxiety. Denies hallucinations.  Past Medical History:  Diagnosis Date   COPD (chronic obstructive pulmonary disease) (HCC)    Edema of lower extremity 03/2018    Past Surgical History:  Procedure Laterality Date   TONSILLECTOMY      Social History  reports that she has been smoking cigarettes. She has a 40.00 pack-year smoking history. She has never used smokeless tobacco. She reports current alcohol use. She reports that she does not use drugs.  No Known Allergies  Family History  Problem Relation Age of Onset   Conductive hearing loss Mother    Diabetes Mother    Heart disease Mother        AMI/CHF   Hypertension Mother    Diabetes Father    Diabetes Sister    High blood pressure Sister    High blood pressure Sister    Diabetes Sister      Prior to Admission medications   Medication Sig Start Date End Date Taking? Authorizing Provider  albuterol (VENTOLIN HFA) 108 (90 Base) MCG/ACT inhaler Inhale 2 puffs into the lungs every 6 (six) hours as needed for wheezing or shortness of breath. 09/04/19   Hoy Register, MD  azithromycin (ZITHROMAX) 250 MG tablet Take two once then one daily until gone 09/04/19   Storm Frisk, MD  budesonide-formoterol [facility]) 160-4.5 MCG/ACT inhaler Inhale 2 puffs into the lungs 2 (two) times daily. 12/26/19 03/25/20  Claiborne Rigg, NP  predniSONE (DELTASONE) 20 MG tablet Take 2 tablets daily for 5 days then stop 09/04/19   Storm Frisk, MD  umeclidinium bromide (INCRUSE ELLIPTA) 62.5 MCG/INH AEPB Inhale 1 puff into the lungs daily. 09/04/19   Hoy Register, MD  varenicline (CHANTIX STARTING MONTH PAK) 0.5 MG X 11 & 1 MG X 42 tablet Take one 0.5 mg tablet by mouth once a day x 3 days, increase to one 0.5 mg tab twice a day x 4  days, increase to one 1 mg tab twice a day 09/04/19   Hoy Register, MD    Physical Exam: Vitals:   10/27/20 1631 10/27/20 1645 10/27/20 1700 10/27/20 1830  BP: (!) 127/56 118/65 139/64 133/79  Pulse: 94 93 97 93  Resp: (!) 23 17 (!) 22 (!) 22  Temp:      TempSrc:      SpO2: 93% 95% 94% 95%    Constitutional: NAD, calm, comfortable Vitals:   10/27/20 1631 10/27/20 1645 10/27/20 1700 10/27/20 1830  BP: (!) 127/56 118/65 139/64 133/79  Pulse: 94 93 97 93  Resp: (!) 23 17 (!) 22 (!) 22  Temp:      TempSrc:      SpO2: 93% 95% 94% 95%   General: WDWN,  Alert and oriented x3.  Eyes: EOMI, PERRL, conjunctivae normal.  Sclera nonicteric HENT:  /AT, external ears normal.  Nares patent without epistasis.  Mucous membranes are moist. Posterior pharynx clear of any exudate or lesions. Poor dentition.  Neck: Soft, normal range of motion, supple, no masses, Trachea midline Respiratory: Minutes breath sounds bilaterally.  Diffuse Rales.  Mild expiratory wheezing. no crackles. Normal respiratory effort. No accessory muscle use.  Cardiovascular: Regular rate and rhythm, no murmurs / rubs / gallops. No extremity edema. 2+ pedal pulses. Abdomen: Soft, no tenderness, nondistended, no rebound or guarding.  No masses palpated. Bowel sounds normoactive Musculoskeletal: FROM. no cyanosis. No joint deformity upper and lower extremities. Normal muscle tone.  Skin: Warm, dry, intact no rashes, lesions, ulcers. No induration Neurologic: CN 2-12 grossly intact.  Normal speech.  Sensation intact, Strength 5/5 in all extremities.   Psychiatric: Normal judgment and insight.  Normal mood.    Labs on Admission: I have personally reviewed following labs and imaging studies  CBC: Recent Labs  Lab 10/27/20 1306 10/27/20 1314 10/27/20 1931  WBC 12.5*  --   --   HGB 15.7* 17.3* 16.0*  HCT 51.6* 51.0* 47.0*  MCV 104.2*  --   --   PLT 305  --   --     Basic Metabolic Panel: Recent Labs  Lab  10/27/20 1306 10/27/20 1314 10/27/20 1931  NA 140 139 136  K 3.9 3.8 4.0  CL 93*  --   --   CO2 36*  --   --   GLUCOSE 118*  --   --   BUN 6*  --   --   CREATININE 0.64  --   --   CALCIUM 9.2  --   --     GFR: CrCl cannot be calculated (Unknown ideal weight.).  Liver Function Tests: No results for input(s): AST, ALT, ALKPHOS, BILITOT, PROT, ALBUMIN in the last 168 hours.  Urine analysis:    Component Value Date/Time   COLORURINE YELLOW 03/29/2018 0514   APPEARANCEUR CLEAR 03/29/2018 0514   LABSPEC 1.011 03/29/2018 0514   PHURINE 7.0 03/29/2018 0514   GLUCOSEU NEGATIVE 03/29/2018 0514   HGBUR NEGATIVE 03/29/2018 0514   BILIRUBINUR NEGATIVE 03/29/2018 0514   KETONESUR NEGATIVE 03/29/2018 0514   PROTEINUR NEGATIVE 03/29/2018 0514   NITRITE NEGATIVE 03/29/2018 0514   LEUKOCYTESUR NEGATIVE 03/29/2018 0514    Radiological Exams on Admission: CT Angio Chest PE W and/or Wo Contrast  Result Date: 10/27/2020 CLINICAL DATA:  Tachy, hypoxic, chest pain Shortness of breath.  Right-sided pain.  History of COPD. EXAM: CT ANGIOGRAPHY CHEST WITH CONTRAST TECHNIQUE: Multidetector CT imaging of the chest was performed using the standard protocol during bolus administration of intravenous contrast. Multiplanar CT image reconstructions and MIPs were obtained to evaluate the vascular anatomy. CONTRAST:  75mL OMNIPAQUE IOHEXOL 350 MG/ML SOLN COMPARISON:  Radiograph earlier today.  Chest CTA 03/31/2018 FINDINGS: Cardiovascular: There are no filling defects within the pulmonary arteries to suggest pulmonary embolus. Borderline cardiomegaly. There is dilatation of the main pulmonary artery at 3.9 cm. Normal aortic caliber with mild atherosclerosis. Conventional branching pattern from the aortic arch. No pericardial effusion. Mediastinum/Nodes: Enlarged right hilar lymph nodes, largest measuring 15 mm, series 5, image 46. There multiple small mediastinal lymph nodes are not enlarged by size criteria. No  left hilar adenopathy. No esophageal wall thickening. No thyroid nodule. Lungs/Pleura: Mild apical predominant emphysema. There is bronchial thickening with areas of bronchial occlusion and mucous plugging within the right  greater than left lower lobe. Dependent opacity in the right lower lobe may be postobstructive. Sub solid nodularity in the left upper lobe spans approximately 15 mm, series 7, image 42. Minimal adjacent patchy airspace disease. Peribronchovascular nodularity in the right upper lobe, series 7, image 44. There is a small right pleural effusion is well as fluid tracking into the inter lobar fissure. Upper Abdomen: Low-density left adrenal thickening is similar to prior, the only partially included in the field of view. No acute upper abdominal findings. Musculoskeletal: Thoracic spondylosis with endplate spurring. There are no acute or suspicious osseous abnormalities. No chest wall soft tissue abnormality. Review of the MIP images confirms the above findings. IMPRESSION: 1. No pulmonary embolus. 2. Mild emphysema. Bronchial thickening with areas of bronchial occlusion and mucous plugging within the right greater than left lower lobe. Dependent opacity in the right lower lobe may be postobstructive. Small right pleural effusion. 3. Peribronchovascular nodularity in the right upper lobe, likely infectious or inflammatory bronchiolitis. 4. Sub solid nodularity in the left upper lobe spans 15 mm, corresponding to radiographic abnormality. This may be infectious, however recommend follow-up as a potential pulmonary nodule. CT at 3-6 months is recommended to assess for persistence. 5. Small right pleural effusion with fluid tracking into the fissure. 6. Enlarged right hilar lymph nodes are likely reactive. 7. Dilatation of the main pulmonary artery suggesting pulmonary arterial hypertension. Aortic Atherosclerosis (ICD10-I70.0) and Emphysema (ICD10-J43.9). Electronically Signed   By: Narda Rutherford  M.D.   On: 10/27/2020 18:35   DG Chest Portable 1 View  Result Date: 10/27/2020 CLINICAL DATA:  Shortness of breath. Onset posterior back pain on the right when coughing 2 days ago. EXAM: PORTABLE CHEST 1 VIEW COMPARISON:  PA and lateral chest 03/28/2018.  CT chest 03/31/2018. FINDINGS: There is cardiomegaly without edema. Aortic atherosclerosis. Hazy opacities are seen in the lower lung zones bilaterally. A more focal nodular opacity is seen in the left mid lung. No pneumothorax or pleural fluid. No acute or focal bony abnormality. IMPRESSION: Hazy opacities in the lower lung zones could be due to atelectasis or pneumonia. Nodular opacity in the left mid lung cannot be characterized. CT chest with contrast is recommended for further evaluation. Cardiomegaly without edema. Aortic Atherosclerosis (ICD10-I70.0). Electronically Signed   By: Drusilla Kanner M.D.   On: 10/27/2020 14:01    EKG: Independently reviewed.  EKG shows sinus tachycardia with PACs, right bundle branch block noted.  No acute ST elevation or depression.  QTc 451  Assessment/Plan Principal Problem:   CAP (community acquired pneumonia) Ms. Gebel is admitted to medical telemetry floor.  Started on Rocephin and azithromycin for antibiotic coverage of commune acquired pneumonia. Requiring supplemental oxygen and is now on nasal cannula at 3 L/min and maintaining oxygen saturations above 93%.  She initially was on nonrebreather but has been weaned down to nasal cannula and tolerating well. IV fluid hydration with LR provided. Incentive spirometer every 2 hours while awake Check CBC, electrolytes and renal function in morning with labs  Active Problems:   Acute respiratory failure with hypoxia and hypercapnia  Patient initially within saturation of 66% on room air when she presented.  Distal blood gas showed a pH of 7.388 with a PCO2 of 69.7 PO2 of 33 bicarb of 44.  After being on oxygen and treated with Solu-Medrol and antibiotics ABG  is improved to a pH of 7.545 with a PCO2 of 44.4 and a PO2 of 123 with a bicarb of 40.  RT to  follow    COPD with acute exacerbation  To patient's home medication of Incruse and Symbicort is substituted with Dulera due to hospital formulary. Solu-Medrol 125 mg IV every 8 hours x3 doses     Elevated Troponin level Initial troponin was mildly elevated at 25 with a repeat troponin of 22.  Patient is not having any chest pain and has no ischemic changes on EKG.     Tobacco use Patient reports that she wants to quit smoking a few weeks.  She drastically cut back her cigarette use.  She has only smoked 3 to 4 cigarettes in the last week and states she does not feel she needs a nicotine patch.    DVT prophylaxis: Lovenox for DVT prophylaxis.   Code Status:   Full Code  Family Communication:  Diagnosis and plan discussed with patient.  Patient verbalized understanding agrees with plan.  Questions answered.  Further recommendations to follow as clinical indicated Disposition Plan:   Patient is from:  Home  Anticipated DC to:  Home  Anticipated DC date:  Anticipate 2 midnight or more stay in the hospital  Anticipated DC barriers: No barriers to discharge identified at this time  Admission status:  Inpatient   Claudean Severance Danaja Lasota MD [facility]  How to contact the [facility] Attending or Consulting provider 7A - 7P or covering provider during after hours 7P -7A, for this patient?   Check the care team in [facility] and look for a) attending/consulting [facility] provider listed and b) the [facility] team listed Log into [email or web address] and use [place]'s universal password to access. If you do not have the password, please contact the hospital operator. Locate the [facility] provider you are looking for under [facility] and page to a number that you can be directly reached. If you still have difficulty reaching the provider, please page the [facility] (Director on Call) for the Hospitalists listed on amion for  assistance.  10/27/2020, 8:24 PM

## 2020-10-27 NOTE — ED Provider Notes (Signed)
[facility] EMERGENCY DEPARTMENT Provider Note   CSN: [id number] Arrival date & time: 10/27/20  1235     History Chief Complaint  Patient presents with   Shortness of Breath    Evelyn Peterson is a 60 y.o. female.   Shortness of Breath Associated symptoms: cough and wheezing   Associated symptoms: no abdominal pain, no chest pain, no ear pain, no fever, no rash, no sore throat and no vomiting       60 yo F with a PMHx of COPD presenting to the ED with sob. Patient reports that she has been intermittently SOB for the past 1 month. She has had an intermittent cough for the past 1 month. She has been diagnosed with bronchitis and given a Z pack, most recently a 1week ago. She states in the last several days, she has become more short of breath. She also states her cough is newly productive of thick sputum. She denies any leg swelling. Exertion makes her SOB worse. She denies any history of PE or DVT. She does report new right sided chest pain that is pleuritic in nature over the past 2 days, sharp, constant. She denies any measured fevers. Patient arrived saturating 66% on RA.   Past Medical History:  Diagnosis Date   COPD (chronic obstructive pulmonary disease) (HCC)    Edema of lower extremity 03/2018    Patient Active Problem List   Diagnosis Date Noted   CAP (community acquired pneumonia) 10/27/2020   Acute respiratory failure with hypoxia and hypercapnia (HCC) 10/27/2020   Tobacco use 11/15/2018   COPD with acute exacerbation (HCC) 03/28/2018    Past Surgical History:  Procedure Laterality Date   TONSILLECTOMY       OB History   No obstetric history on file.     Family History  Problem Relation Age of Onset   Conductive hearing loss Mother    Diabetes Mother    Heart disease Mother        AMI/CHF   Hypertension Mother    Diabetes Father    Diabetes Sister    High blood pressure Sister    High blood pressure Sister    Diabetes Sister      Social History   Tobacco Use   Smoking status: Every Day    Packs/day: 1.00    Years: 40.00    Pack years: 40.00    Types: Cigarettes    Last attempt to quit: 04/11/2018    Years since quitting: 2.5   Smokeless tobacco: Never  Vaping Use   Vaping Use: Never used  Substance Use Topics   Alcohol use: Yes    Comment: beer- weekly   Drug use: No    Home Medications Prior to Admission medications   Medication Sig Start Date End Date Taking? Authorizing Provider  albuterol (VENTOLIN HFA) 108 (90 Base) MCG/ACT inhaler Inhale 2 puffs into the lungs every 6 (six) hours as needed for wheezing or shortness of breath. 09/04/19   Hoy Register, MD  azithromycin (ZITHROMAX) 250 MG tablet Take two once then one daily until gone 09/04/19   Storm Frisk, MD  budesonide-formoterol [facility]) 160-4.5 MCG/ACT inhaler Inhale 2 puffs into the lungs 2 (two) times daily. 12/26/19 03/25/20  Claiborne Rigg, NP  predniSONE (DELTASONE) 20 MG tablet Take 2 tablets daily for 5 days then stop 09/04/19   Storm Frisk, MD  umeclidinium bromide (INCRUSE ELLIPTA) 62.5 MCG/INH AEPB Inhale 1 puff into the lungs daily.  09/04/19   Hoy Register, MD  varenicline (CHANTIX STARTING MONTH PAK) 0.5 MG X 11 & 1 MG X 42 tablet Take one 0.5 mg tablet by mouth once a day x 3 days, increase to one 0.5 mg tab twice a day x 4 days, increase to one 1 mg tab twice a day 09/04/19   Hoy Register, MD    Allergies    Patient has no known allergies.  Review of Systems   Review of Systems  Constitutional:  Negative for activity change, appetite change, chills, fatigue and fever.  HENT:  Negative for ear pain and sore throat.   Eyes:  Negative for pain and visual disturbance.  Respiratory:  Positive for cough, shortness of breath and wheezing.   Cardiovascular:  Negative for chest pain, palpitations and leg swelling.  Gastrointestinal:  Negative for abdominal pain and vomiting.  Genitourinary:  Negative for dysuria and  hematuria.  Musculoskeletal:  Negative for arthralgias and back pain.  Skin:  Negative for color change and rash.  Neurological:  Negative for seizures and syncope.  All other systems reviewed and are negative.  Physical Exam Updated Vital Signs BP 133/79   Pulse 93   Temp 98 F (36.7 C) (Oral)   Resp (!) 22   SpO2 95%   Physical Exam Vitals and nursing note reviewed.  Constitutional:      General: She is not in acute distress.    Appearance: She is well-developed. She is not ill-appearing or toxic-appearing.  HENT:     Head: Normocephalic and atraumatic.  Eyes:     Conjunctiva/sclera: Conjunctivae normal.  Cardiovascular:     Rate and Rhythm: Regular rhythm. Tachycardia present.     Heart sounds: No murmur heard. Pulmonary:     Effort: Pulmonary effort is normal. Tachypnea present. No respiratory distress.     Breath sounds: Examination of the right-upper field reveals wheezing. Examination of the left-upper field reveals wheezing. Examination of the right-middle field reveals wheezing. Examination of the left-middle field reveals wheezing. Examination of the right-lower field reveals wheezing. Examination of the left-lower field reveals wheezing. Decreased breath sounds and wheezing present. No rhonchi or rales.     Comments: Prlonged expiratory phase Abdominal:     Palpations: Abdomen is soft.     Tenderness: There is no abdominal tenderness.  Musculoskeletal:     Cervical back: Neck supple.  Skin:    General: Skin is warm and dry.  Neurological:     Mental Status: She is alert.    ED Results / Procedures / Treatments   Labs (all labs ordered are listed, but only abnormal results are displayed) Labs Reviewed  BASIC METABOLIC PANEL - Abnormal; Notable for the following components:      Result Value   Chloride 93 (*)    CO2 36 (*)    Glucose, Bld 118 (*)    BUN 6 (*)    All other components within normal limits  CBC - Abnormal; Notable for the following  components:   WBC 12.5 (*)    Hemoglobin 15.7 (*)    HCT 51.6 (*)    MCV 104.2 (*)    All other components within normal limits  I-STAT VENOUS BLOOD GAS, ED - Abnormal; Notable for the following components:   pCO2, Ven 69.7 (*)    Bicarbonate 42.0 (*)    TCO2 44 (*)    Acid-Base Excess 13.0 (*)    Calcium, Ion 1.11 (*)    HCT 51.0 (*)  Hemoglobin 17.3 (*)    All other components within normal limits  I-STAT VENOUS BLOOD GAS, ED - Abnormal; Notable for the following components:   pH, Ven 7.545 (*)    pO2, Ven 123.0 (*)    Bicarbonate 38.5 (*)    TCO2 40 (*)    Acid-Base Excess 14.0 (*)    Calcium, Ion 0.91 (*)    HCT 47.0 (*)    Hemoglobin 16.0 (*)    All other components within normal limits  TROPONIN I (HIGH SENSITIVITY) - Abnormal; Notable for the following components:   Troponin I (High Sensitivity) 25 (*)    All other components within normal limits  TROPONIN I (HIGH SENSITIVITY) - Abnormal; Notable for the following components:   Troponin I (High Sensitivity) 22 (*)    All other components within normal limits  RESP PANEL BY RT-PCR (FLU A&B, COVID) [id number]    EKG None  Radiology CT Angio Chest PE W and/or Wo Contrast  Result Date: 10/27/2020 CLINICAL DATA:  Tachy, hypoxic, chest pain Shortness of breath.  Right-sided pain.  History of COPD. EXAM: CT ANGIOGRAPHY CHEST WITH CONTRAST TECHNIQUE: Multidetector CT imaging of the chest was performed using the standard protocol during bolus administration of intravenous contrast. Multiplanar CT image reconstructions and MIPs were obtained to evaluate the vascular anatomy. CONTRAST:  75mL OMNIPAQUE IOHEXOL 350 MG/ML SOLN COMPARISON:  Radiograph earlier today.  Chest CTA 03/31/2018 FINDINGS: Cardiovascular: There are no filling defects within the pulmonary arteries to suggest pulmonary embolus. Borderline cardiomegaly. There is dilatation of the main pulmonary artery at 3.9 cm. Normal aortic caliber with mild atherosclerosis.  Conventional branching pattern from the aortic arch. No pericardial effusion. Mediastinum/Nodes: Enlarged right hilar lymph nodes, largest measuring 15 mm, series 5, image 46. There multiple small mediastinal lymph nodes are not enlarged by size criteria. No left hilar adenopathy. No esophageal wall thickening. No thyroid nodule. Lungs/Pleura: Mild apical predominant emphysema. There is bronchial thickening with areas of bronchial occlusion and mucous plugging within the right greater than left lower lobe. Dependent opacity in the right lower lobe may be postobstructive. Sub solid nodularity in the left upper lobe spans approximately 15 mm, series 7, image 42. Minimal adjacent patchy airspace disease. Peribronchovascular nodularity in the right upper lobe, series 7, image 44. There is a small right pleural effusion is well as fluid tracking into the inter lobar fissure. Upper Abdomen: Low-density left adrenal thickening is similar to prior, the only partially included in the field of view. No acute upper abdominal findings. Musculoskeletal: Thoracic spondylosis with endplate spurring. There are no acute or suspicious osseous abnormalities. No chest wall soft tissue abnormality. Review of the MIP images confirms the above findings. IMPRESSION: 1. No pulmonary embolus. 2. Mild emphysema. Bronchial thickening with areas of bronchial occlusion and mucous plugging within the right greater than left lower lobe. Dependent opacity in the right lower lobe may be postobstructive. Small right pleural effusion. 3. Peribronchovascular nodularity in the right upper lobe, likely infectious or inflammatory bronchiolitis. 4. Sub solid nodularity in the left upper lobe spans 15 mm, corresponding to radiographic abnormality. This may be infectious, however recommend follow-up as a potential pulmonary nodule. CT at 3-6 months is recommended to assess for persistence. 5. Small right pleural effusion with fluid tracking into the fissure.  6. Enlarged right hilar lymph nodes are likely reactive. 7. Dilatation of the main pulmonary artery suggesting pulmonary arterial hypertension. Aortic Atherosclerosis (ICD10-I70.0) and Emphysema (ICD10-J43.9). Electronically Signed   By: Ivette LoyalMelanie  Sanford M.D.  On: 10/27/2020 18:35   DG Chest Portable 1 View  Result Date: 10/27/2020 CLINICAL DATA:  Shortness of breath. Onset posterior back pain on the right when coughing 2 days ago. EXAM: PORTABLE CHEST 1 VIEW COMPARISON:  PA and lateral chest 03/28/2018.  CT chest 03/31/2018. FINDINGS: There is cardiomegaly without edema. Aortic atherosclerosis. Hazy opacities are seen in the lower lung zones bilaterally. A more focal nodular opacity is seen in the left mid lung. No pneumothorax or pleural fluid. No acute or focal bony abnormality. IMPRESSION: Hazy opacities in the lower lung zones could be due to atelectasis or pneumonia. Nodular opacity in the left mid lung cannot be characterized. CT chest with contrast is recommended for further evaluation. Cardiomegaly without edema. Aortic Atherosclerosis (ICD10-I70.0). Electronically Signed   By: Drusilla Kanner M.D.   On: 10/27/2020 14:01    Procedures Procedures   Medications Ordered in ED Medications  azithromycin (ZITHROMAX) 500 mg in sodium chloride 0.9 % 250 mL IVPB (0 mg Intravenous Stopped 10/27/20 1748)  methylPREDNISolone sodium succinate (SOLU-MEDROL) 125 mg/2 mL injection 125 mg (125 mg Intravenous Given 10/27/20 1316)  albuterol (VENTOLIN HFA) 108 (90 Base) MCG/ACT inhaler 8 puff (8 puffs Inhalation Given 10/27/20 1316)  cefTRIAXone (ROCEPHIN) 1 g in sodium chloride 0.9 % 100 mL IVPB (0 g Intravenous Stopped 10/27/20 1945)  iohexol (OMNIPAQUE) 350 MG/ML injection 75 mL (75 mLs Intravenous Contrast Given 10/27/20 1754)    ED Course  I have reviewed the triage vital signs and the nursing notes.  Pertinent labs & imaging results that were available during my care of the patient were reviewed by me and  considered in my medical decision making (see chart for details).    MDM Rules/Calculators/A&P                           60 yo F with COPD presenting to the ED with SOB. Vitals reviewed, hypoxic on RA. Tachycardic. Stable on nonrebreather, then able to be weaned down to 4-6L [place]. When removed even at rest, patient will desaturate to 76% with an excellent waveform. DDX includes PE, PNA, COPD exacerbation. She denies orthopnea, believe that volume overload is less likely. Will obtain CBC, BMP, CXR. Will give steroids and bronchodilators as she is wheezing on my exam. Will obtain CTA PE study given her new hypoxia, tachycardia, and right sided chest pain.   Following albuterol, patient reports significant subjective improvement and her breath sounds cleared but she remains hypoxic. CXR concerning for possible PNA, therefore given IV antibiotics. CTA PE study pending, but after that patient will require admission for acute hypoxic respiratory failure.   Hand off given at 1500 to oncoming provider.   Final Clinical Impression(s) / ED Diagnoses Final diagnoses:  Community acquired pneumonia of right lung, unspecified part of lung    Rx / DC Orders ED Discharge Orders     None        Lenard Lance, MD 10/27/20 2149    Pricilla Loveless, MD 10/28/20 [phone]

## 2020-10-27 NOTE — ED Notes (Signed)
Patient up to [facility] sat decreased to 77%, however patient states she is feeling better. Placed back on [place] and sat rapidly increased to 94%

## 2020-10-28 DIAGNOSIS — J9601 Acute respiratory failure with hypoxia: Secondary | ICD-10-CM

## 2020-10-28 DIAGNOSIS — J441 Chronic obstructive pulmonary disease with (acute) exacerbation: Secondary | ICD-10-CM

## 2020-10-28 DIAGNOSIS — J9602 Acute respiratory failure with hypercapnia: Secondary | ICD-10-CM

## 2020-10-28 LAB — BASIC METABOLIC PANEL
Anion gap: 6 (ref 5–15)
BUN: 8 mg/dL (ref 8–23)
CO2: 38 mmol/L — ABNORMAL HIGH (ref 22–32)
Calcium: 8.9 mg/dL (ref 8.9–10.3)
Chloride: 95 mmol/L — ABNORMAL LOW (ref 98–111)
Creatinine, Ser: 0.52 mg/dL (ref 0.44–1.00)
GFR, Estimated: >60 mL/min (ref >60)
Glucose, Bld: 160 mg/dL — ABNORMAL HIGH (ref 70–99)
Potassium: 4.8 mmol/L (ref 3.5–5.1)
Sodium: 139 mmol/L (ref 135–145)

## 2020-10-28 LAB — CBC
HCT: 47.7 % — ABNORMAL HIGH (ref 36.0–46.0)
Hemoglobin: 14.6 g/dL (ref 12.0–15.0)
MCH: 32 pg (ref 26.0–34.0)
MCHC: 30.6 g/dL (ref 30.0–36.0)
MCV: 104.6 fL — ABNORMAL HIGH (ref 80.0–100.0)
Platelets: 272 10*3/uL (ref 150–400)
RBC: 4.56 MIL/uL (ref 3.87–5.11)
RDW: 13.2 % (ref 11.5–15.5)
WBC: 10.1 10*3/uL (ref 4.0–10.5)
nRBC: 0 % (ref 0.0–0.2)

## 2020-10-28 LAB — HIV ANTIBODY (ROUTINE TESTING W REFLEX): HIV Screen 4th Generation wRfx: NONREACTIVE

## 2020-10-28 MED ORDER — ALBUTEROL SULFATE (2.5 MG/3ML) 0.083% IN NEBU: 2.5000 mg | INHALATION_SOLUTION | RESPIRATORY_TRACT | Status: DC | PRN

## 2020-10-28 MED ORDER — IPRATROPIUM-ALBUTEROL 0.5-2.5 (3) MG/3ML IN SOLN
3.0000 mL | Freq: Four times a day (QID) | RESPIRATORY_TRACT | Status: DC
  Administered 2020-10-28 – 2020-10-29 (×3): 3 mL via RESPIRATORY_TRACT
  Filled 2020-10-28 (×3): qty 3

## 2020-10-28 MED ORDER — METHYLPREDNISOLONE SODIUM SUCC 40 MG IJ SOLR
40.0000 mg | Freq: Two times a day (BID) | INTRAMUSCULAR | Status: DC
  Administered 2020-10-28 – 2020-10-29 (×2): 40 mg via INTRAVENOUS
  Filled 2020-10-28 (×2): qty 1

## 2020-10-28 MED ORDER — IPRATROPIUM-ALBUTEROL 0.5-2.5 (3) MG/3ML IN SOLN
3.0000 mL | RESPIRATORY_TRACT | Status: DC | PRN
  Administered 2020-10-28: 3 mL via RESPIRATORY_TRACT
  Filled 2020-10-28: qty 3

## 2020-10-28 NOTE — ED Notes (Signed)
Attempt to call report, floor nurse not available at this time, will call back

## 2020-10-28 NOTE — ED Notes (Signed)
Pt resting at this time. VS WNL. Visible chest rise and fall noted. Pt appears in NAD.  

## 2020-10-28 NOTE — Progress Notes (Signed)
PROGRESS NOTE    Evelyn Peterson  KKX:[id number] DOB: 05-05-1960 DOA: 10/27/2020 PCP: Claiborne Rigg, NP    Brief Narrative:  60 y.o. female with medical history significant for COPD who presented by EMS with complaint of shortness of breath. Pt was found to have RLL PNA on imaging with concerns of COPD exacerbation. Pt was admitted for further work up  Assessment & Plan:   Principal Problem:   CAP (community acquired pneumonia) Active Problems:   COPD with acute exacerbation (HCC)   Tobacco use   Acute respiratory failure with hypoxia and hypercapnia (HCC)  Principal Problem:   CAP (community acquired pneumonia) with sepsis present on admission -Pt is continued on azithromycin and rocephin -Needing up to 5.5 L [place] this AM, still sob -Presenting RR of 31 with wbc of 12.5 -Recheck cbc in AM   Active Problems:   Acute respiratory failure with hypoxia and hypercapnia -Patient initially within saturation of 66% on room air when she presented.   -Likely secondary to above PNA as well as COPD exacerbation below -Wean O2 as tolerated. Patient reports baseline 2LNC at night     COPD with acute exacerbation -Pt was continued on home medication of Incruse and Symbicort is substituted with Dulera due to hospital formulary. -Still with audible wheezing and decreased breath sounds throughout -Will continue solumedrol 40mg  q12 hrs with scheduled neb tx -Wean O2 as tolerated      Elevated Troponin level Initial troponin was mildly elevated at 25 with a repeat troponin of 22.  Patient is not having any chest pain and has no ischemic changes on EKG. -Remains without chest pain this AM     Tobacco use -cessation done at bedside -Pt declines nicotine patch at this time    DVT prophylaxis: Lovenox subq Code Status: Full Family Communication: Pt in room, family not at bedside  Status is: Inpatient  Remains inpatient appropriate because:Inpatient level of care appropriate due to  severity of illness  Dispo: The patient is from: Home              Anticipated d/c is to: Home              Patient currently is not medically stable to d/c.   Difficult to place patient No       Consultants:    Procedures:    Antimicrobials: Anti-infectives (From admission, onward)    Start     Dose/Rate Route Frequency Ordered Stop   10/27/20 2345  cefTRIAXone (ROCEPHIN) 1 g in sodium chloride 0.9 % 100 mL IVPB        1 g 200 mL/hr over 30 Minutes Intravenous Every 24 hours 10/27/20 2332     10/27/20 2345  azithromycin (ZITHROMAX) 500 mg in sodium chloride 0.9 % 250 mL IVPB  Status:  Discontinued        500 mg 250 mL/hr over 60 Minutes Intravenous Every 24 hours 10/27/20 2332 10/28/20 0953   10/27/20 1530  cefTRIAXone (ROCEPHIN) 1 g in sodium chloride 0.9 % 100 mL IVPB        1 g 200 mL/hr over 30 Minutes Intravenous  Once 10/27/20 1517 10/27/20 1945   10/27/20 1530  azithromycin (ZITHROMAX) 500 mg in sodium chloride 0.9 % 250 mL IVPB        500 mg 250 mL/hr over 60 Minutes Intravenous Every 24 hours 10/27/20 1517         Subjective: Still sob with wheezing  Objective: Vitals:  10/28/20 0630 10/28/20 0645 10/28/20 0800 10/28/20 1006  BP: (!) 147/85 132/68 (!) 144/83 132/89  Pulse: 66 74 62 89  Resp: (!) 22 20 20 18   Temp:   97.8 F (36.6 C) 98.3 F (36.8 C)  TempSrc:    Oral  SpO2: 94% 97% 97% 94%  Weight:    115.7 kg  Height:    5\' 8"  (1.727 m)    Intake/Output Summary (Last 24 hours) at 10/28/2020 1641 Last data filed at 10/27/2020 1945 Gross per 24 hour  Intake 50 ml  Output --  Net 50 ml   Filed Weights   10/28/20 1006  Weight: 115.7 kg    Examination: General exam: Awake, laying in bed, in nad Respiratory system: Increased resp effort, audible wheezing Cardiovascular system: regular rate, s1, s2 Gastrointestinal system: Soft, nondistended, positive BS Central nervous system: CN2-12 grossly intact, strength intact Extremities: Perfused, no  clubbing Skin: Normal skin turgor, no notable skin lesions seen Psychiatry: Mood normal // no visual hallucinations    Data Reviewed: I have personally reviewed following labs and imaging studies  CBC: Recent Labs  Lab 10/27/20 1306 10/27/20 1314 10/27/20 1931 10/28/20 0454  WBC 12.5*  --   --  10.1  HGB 15.7* 17.3* 16.0* 14.6  HCT 51.6* 51.0* 47.0* 47.7*  MCV 104.2*  --   --  104.6*  PLT 305  --   --  272   Basic Metabolic Panel: Recent Labs  Lab 10/27/20 1306 10/27/20 1314 10/27/20 1931 10/28/20 0454  NA 140 139 136 139  K 3.9 3.8 4.0 4.8  CL 93*  --   --  95*  CO2 36*  --   --  38*  GLUCOSE 118*  --   --  160*  BUN 6*  --   --  8  CREATININE 0.64  --   --  0.52  CALCIUM 9.2  --   --  8.9   GFR: Estimated Creatinine Clearance: 98.6 mL/min (by C-G formula based on SCr of 0.52 mg/dL). Liver Function Tests: No results for input(s): AST, ALT, ALKPHOS, BILITOT, PROT, ALBUMIN in the last 168 hours. No results for input(s): LIPASE, AMYLASE in the last 168 hours. No results for input(s): AMMONIA in the last 168 hours. Coagulation Profile: No results for input(s): INR, PROTIME in the last 168 hours. Cardiac Enzymes: No results for input(s): CKTOTAL, CKMB, CKMBINDEX, TROPONINI in the last 168 hours. BNP (last 3 results) No results for input(s): PROBNP in the last 8760 hours. HbA1C: No results for input(s): HGBA1C in the last 72 hours. CBG: No results for input(s): GLUCAP in the last 168 hours. Lipid Profile: No results for input(s): CHOL, HDL, LDLCALC, TRIG, CHOLHDL, LDLDIRECT in the last 72 hours. Thyroid Function Tests: No results for input(s): TSH, T4TOTAL, FREET4, T3FREE, THYROIDAB in the last 72 hours. Anemia Panel: No results for input(s): VITAMINB12, FOLATE, FERRITIN, TIBC, IRON, RETICCTPCT in the last 72 hours. Sepsis Labs: No results for input(s): PROCALCITON, LATICACIDVEN in the last 168 hours.  Recent Results (from the past 240 hour(s))  Resp Panel by  RT-PCR (Flu A&B, Covid) Nasopharyngeal Swab     Status: None   Collection Time: 10/27/20  1:05 PM   Specimen: Nasopharyngeal Swab; Nasopharyngeal(NP) swabs in vial transport medium  Result Value Ref Range Status   SARS Coronavirus 2 by RT PCR NEGATIVE NEGATIVE Final    Comment: (NOTE) SARS-CoV-2 target nucleic acids are NOT DETECTED.  The SARS-CoV-2 RNA is generally detectable in upper respiratory specimens  during the acute phase of infection. The lowest concentration of SARS-CoV-2 viral copies this assay can detect is 138 copies/mL. A negative result does not preclude SARS-Cov-2 infection and should not be used as the sole basis for treatment or other patient management decisions. A negative result may occur with  improper specimen collection/handling, submission of specimen other than nasopharyngeal swab, presence of viral mutation(s) within the areas targeted by this assay, and inadequate number of viral copies(<138 copies/mL). A negative result must be combined with clinical observations, patient history, and epidemiological information. The expected result is Negative.  Fact Sheet for Patients:  BloggerCourse.comhttps://www.fda.gov/media/152166/download  Fact Sheet for Healthcare Providers:  SeriousBroker.ithttps://www.fda.gov/media/152162/download  This test is no t yet approved or cleared by the [place] FDA and  has been authorized for detection and/or diagnosis of SARS-CoV-2 by FDA under an Emergency Use Authorization (EUA). This EUA will remain  in effect (meaning this test can be used) for the duration of the COVID-19 declaration under Section 564(b)(1) of the Act, 21 U.S.C.section 360bbb-3(b)(1), unless the authorization is terminated  or revoked sooner.       Influenza A by PCR NEGATIVE NEGATIVE Final   Influenza B by PCR NEGATIVE NEGATIVE Final    Comment: (NOTE) The Xpert Xpress SARS-CoV-2/FLU/RSV plus assay is intended as an aid in the diagnosis of influenza from Nasopharyngeal swab  specimens and should not be used as a sole basis for treatment. Nasal washings and aspirates are unacceptable for Xpert Xpress SARS-CoV-2/FLU/RSV testing.  Fact Sheet for Patients: BloggerCourse.comhttps://www.fda.gov/media/152166/download  Fact Sheet for Healthcare Providers: SeriousBroker.ithttps://www.fda.gov/media/152162/download  This test is not yet approved or cleared by the [place] FDA and has been authorized for detection and/or diagnosis of SARS-CoV-2 by FDA under an Emergency Use Authorization (EUA). This EUA will remain in effect (meaning this test can be used) for the duration of the COVID-19 declaration under Section 564(b)(1) of the Act, 21 U.S.C. section 360bbb-3(b)(1), unless the authorization is terminated or revoked.  Performed at [facility], [address] [place], [place] [address]      Radiology Studies: CT Angio Chest PE W and/or Wo Contrast  Result Date: 10/27/2020 CLINICAL DATA:  Tachy, hypoxic, chest pain Shortness of breath.  Right-sided pain.  History of COPD. EXAM: CT ANGIOGRAPHY CHEST WITH CONTRAST TECHNIQUE: Multidetector CT imaging of the chest was performed using the standard protocol during bolus administration of intravenous contrast. Multiplanar CT image reconstructions and MIPs were obtained to evaluate the vascular anatomy. CONTRAST:  75mL OMNIPAQUE IOHEXOL 350 MG/ML SOLN COMPARISON:  Radiograph earlier today.  Chest CTA 03/31/2018 FINDINGS: Cardiovascular: There are no filling defects within the pulmonary arteries to suggest pulmonary embolus. Borderline cardiomegaly. There is dilatation of the main pulmonary artery at 3.9 cm. Normal aortic caliber with mild atherosclerosis. Conventional branching pattern from the aortic arch. No pericardial effusion. Mediastinum/Nodes: Enlarged right hilar lymph nodes, largest measuring 15 mm, series 5, image 46. There multiple small mediastinal lymph nodes are not enlarged by size criteria. No left hilar adenopathy. No esophageal wall  thickening. No thyroid nodule. Lungs/Pleura: Mild apical predominant emphysema. There is bronchial thickening with areas of bronchial occlusion and mucous plugging within the right greater than left lower lobe. Dependent opacity in the right lower lobe may be postobstructive. Sub solid nodularity in the left upper lobe spans approximately 15 mm, series 7, image 42. Minimal adjacent patchy airspace disease. Peribronchovascular nodularity in the right upper lobe, series 7, image 44. There is a small right pleural effusion is well as  fluid tracking into the inter lobar fissure. Upper Abdomen: Low-density left adrenal thickening is similar to prior, the only partially included in the field of view. No acute upper abdominal findings. Musculoskeletal: Thoracic spondylosis with endplate spurring. There are no acute or suspicious osseous abnormalities. No chest wall soft tissue abnormality. Review of the MIP images confirms the above findings. IMPRESSION: 1. No pulmonary embolus. 2. Mild emphysema. Bronchial thickening with areas of bronchial occlusion and mucous plugging within the right greater than left lower lobe. Dependent opacity in the right lower lobe may be postobstructive. Small right pleural effusion. 3. Peribronchovascular nodularity in the right upper lobe, likely infectious or inflammatory bronchiolitis. 4. Sub solid nodularity in the left upper lobe spans 15 mm, corresponding to radiographic abnormality. This may be infectious, however recommend follow-up as a potential pulmonary nodule. CT at 3-6 months is recommended to assess for persistence. 5. Small right pleural effusion with fluid tracking into the fissure. 6. Enlarged right hilar lymph nodes are likely reactive. 7. Dilatation of the main pulmonary artery suggesting pulmonary arterial hypertension. Aortic Atherosclerosis (ICD10-I70.0) and Emphysema (ICD10-J43.9). Electronically Signed   By: Narda Rutherford M.D.   On: 10/27/2020 18:35   DG Chest  Portable 1 View  Result Date: 10/27/2020 CLINICAL DATA:  Shortness of breath. Onset posterior back pain on the right when coughing 2 days ago. EXAM: PORTABLE CHEST 1 VIEW COMPARISON:  PA and lateral chest 03/28/2018.  CT chest 03/31/2018. FINDINGS: There is cardiomegaly without edema. Aortic atherosclerosis. Hazy opacities are seen in the lower lung zones bilaterally. A more focal nodular opacity is seen in the left mid lung. No pneumothorax or pleural fluid. No acute or focal bony abnormality. IMPRESSION: Hazy opacities in the lower lung zones could be due to atelectasis or pneumonia. Nodular opacity in the left mid lung cannot be characterized. CT chest with contrast is recommended for further evaluation. Cardiomegaly without edema. Aortic Atherosclerosis (ICD10-I70.0). Electronically Signed   By: Drusilla Kanner M.D.   On: 10/27/2020 14:01    Scheduled Meds:  enoxaparin (LOVENOX) injection  40 mg Subcutaneous Daily   ipratropium-albuterol  3 mL Nebulization Q6H   methylPREDNISolone (SOLU-MEDROL) injection  40 mg Intravenous Q12H   mometasone-formoterol  2 puff Inhalation BID   umeclidinium bromide  1 puff Inhalation Daily   Continuous Infusions:  azithromycin Stopped (10/27/20 1748)   cefTRIAXone (ROCEPHIN)  IV       LOS: 1 day   Rickey Barbara, MD [facility] Pager On Amion  If 7PM-7AM, please contact night-coverage 10/28/2020, 4:41 PM

## 2020-10-29 DIAGNOSIS — Z720 Tobacco use: Secondary | ICD-10-CM

## 2020-10-29 LAB — RESPIRATORY PANEL BY PCR

## 2020-10-29 LAB — CBC
HCT: 44.7 % (ref 36.0–46.0)
Hemoglobin: 13.6 g/dL (ref 12.0–15.0)
MCH: 31.8 pg (ref 26.0–34.0)
MCHC: 30.4 g/dL (ref 30.0–36.0)
MCV: 104.4 fL — ABNORMAL HIGH (ref 80.0–100.0)
Platelets: 270 10*3/uL (ref 150–400)
RBC: 4.28 MIL/uL (ref 3.87–5.11)
RDW: 13.2 % (ref 11.5–15.5)
WBC: 14.6 10*3/uL — ABNORMAL HIGH (ref 4.0–10.5)
nRBC: 0 % (ref 0.0–0.2)

## 2020-10-29 LAB — COMPREHENSIVE METABOLIC PANEL
ALT: 34 U/L (ref 0–44)
AST: 19 U/L (ref 15–41)
Albumin: 2.4 g/dL — ABNORMAL LOW (ref 3.5–5.0)
Alkaline Phosphatase: 73 U/L (ref 38–126)
Anion gap: 6 (ref 5–15)
BUN: 13 mg/dL (ref 8–23)
CO2: 38 mmol/L — ABNORMAL HIGH (ref 22–32)
Calcium: 8.8 mg/dL — ABNORMAL LOW (ref 8.9–10.3)
Chloride: 96 mmol/L — ABNORMAL LOW (ref 98–111)
Creatinine, Ser: 0.63 mg/dL (ref 0.44–1.00)
GFR, Estimated: >60 mL/min (ref >60)
Glucose, Bld: 161 mg/dL — ABNORMAL HIGH (ref 70–99)
Potassium: 4.8 mmol/L (ref 3.5–5.1)
Sodium: 140 mmol/L (ref 135–145)
Total Bilirubin: 0.7 mg/dL (ref 0.3–1.2)
Total Protein: 6 g/dL — ABNORMAL LOW (ref 6.5–8.1)

## 2020-10-29 MED ORDER — IPRATROPIUM-ALBUTEROL 0.5-2.5 (3) MG/3ML IN SOLN: 3.0000 mL | Freq: Four times a day (QID) | RESPIRATORY_TRACT | Status: DC | PRN

## 2020-10-29 MED ORDER — GUAIFENESIN ER 600 MG PO TB12
1200.0000 mg | ORAL_TABLET | Freq: Two times a day (BID) | ORAL | Status: DC
  Administered 2020-10-29 – 2020-10-30 (×2): 1200 mg via ORAL
  Filled 2020-10-29 (×2): qty 2

## 2020-10-29 NOTE — Progress Notes (Signed)
PROGRESS NOTE    Evelyn Peterson  CBU:[id number] DOB: 1960/04/06 DOA: 10/27/2020 PCP: Claiborne Rigg, NP   Brief Narrative: Evelyn Peterson is a 60 y.o. female history of COPD, chronic respiratory failure,, chronic respite failure on oxygen, tobacco use.  Patient resented secondary to dyspnea and right chest wall pain was found to have concern for pneumonia with associated COPD exacerbation and worsening hypoxia.  Patient started on empiric antibiotics and oxygen in addition to high dose steroids.   Assessment & Plan:   Principal Problem:   CAP (community acquired pneumonia) Active Problems:   COPD with acute exacerbation (HCC)   Tobacco use   Acute respiratory failure with hypoxia and hypercapnia (HCC)   Community acquired pneumonia Imaging concerning for infectious/inflammatory bronchiolitis. Mucous plugging noted. Started on Ceftriaxone and azithromycin.  COPD exacerbation Patient treated with solumedrol and Duonebs. Home Incruse Ellipta continued and homeSymbicort substituted for [facility] inpatient. Wheezing seems resolved. -Discontinue solu-medrol -PT/OT  Acute on chronic respiratory failure with hypoxia Secondary to above. Patient is on 2 Lpm as an outpatient. -Wean to 2 Lpm as able -Ambulatory pulse oximetry  Left upper lobe nodularity Possibly infectious however recommendation is for repeat CT in 3-6 months.   DVT prophylaxis: Lovenox Code Status:   Code Status: Full Code Family Communication: None at bedside Disposition Plan: Discharge home likely in 1-2 days   Consultants:  None  Procedures:  None  Antimicrobials: Ceftriaxone Azithromycin    Subjective: No chest pain or dyspnea this morning. Some cough.  Objective: Vitals:   10/28/20 2052 10/29/20 0222 10/29/20 0706 10/29/20 1230  BP: 108/82   140/68  Pulse: 88   77  Resp: 18   17  Temp: 97.8 F (36.6 C)   98.1 F (36.7 C)  TempSrc: Oral   Oral  SpO2: 90% 95% 96% 95%  Weight:       Height:        Intake/Output Summary (Last 24 hours) at 10/29/2020 1641 Last data filed at 10/29/2020 0500 Gross per 24 hour  Intake 2366.09 ml  Output --  Net 2366.09 ml   Filed Weights   10/28/20 1006  Weight: 115.7 kg    Examination:  General exam: Appears calm and comfortable  Respiratory system: Diminished but no wheezing or rhonchi. Respiratory effort normal. Cardiovascular system: S1 & S2 heard, RRR. No murmurs, rubs, gallops or clicks. Gastrointestinal system: Abdomen is nondistended, soft and nontender. No organomegaly or masses felt. Normal bowel sounds heard. Central nervous system: Alert and oriented. No focal neurological deficits. Musculoskeletal: No edema. No calf tenderness Skin: No cyanosis. No rashes Psychiatry: Judgement and insight appear normal. Mood & affect appropriate.     Data Reviewed: I have personally reviewed following labs and imaging studies  CBC Lab Results  Component Value Date   WBC 14.6 (H) 10/29/2020   RBC 4.28 10/29/2020   HGB 13.6 10/29/2020   HCT 44.7 10/29/2020   MCV 104.4 (H) 10/29/2020   MCH 31.8 10/29/2020   PLT 270 10/29/2020   MCHC 30.4 10/29/2020   RDW 13.2 10/29/2020     Last metabolic panel Lab Results  Component Value Date   NA 140 10/29/2020   K 4.8 10/29/2020   CL 96 (L) 10/29/2020   CO2 38 (H) 10/29/2020   BUN 13 10/29/2020   CREATININE 0.63 10/29/2020   GLUCOSE 161 (H) 10/29/2020   GFRNONAA >60 10/29/2020   GFRAA 110 12/26/2018   CALCIUM 8.8 (L) 10/29/2020   PROT 6.0 (  L) 10/29/2020   ALBUMIN 2.4 (L) 10/29/2020   LABGLOB 2.7 12/26/2018   AGRATIO 1.7 12/26/2018   BILITOT 0.7 10/29/2020   ALKPHOS 73 10/29/2020   AST 19 10/29/2020   ALT 34 10/29/2020   ANIONGAP 6 10/29/2020    CBG (last 3)  No results for input(s): GLUCAP in the last 72 hours.   GFR: Estimated Creatinine Clearance: 98.6 mL/min (by C-G formula based on SCr of 0.63 mg/dL).  Coagulation Profile: No results for input(s): INR,  PROTIME in the last 168 hours.  Recent Results (from the past 240 hour(s))  Resp Panel by RT-PCR (Flu A&B, Covid) Nasopharyngeal Swab     Status: None   Collection Time: 10/27/20  1:05 PM   Specimen: Nasopharyngeal Swab; Nasopharyngeal(NP) swabs in vial transport medium  Result Value Ref Range Status   SARS Coronavirus 2 by RT PCR NEGATIVE NEGATIVE Final    Comment: (NOTE) SARS-CoV-2 target nucleic acids are NOT DETECTED.  The SARS-CoV-2 RNA is generally detectable in upper respiratory specimens during the acute phase of infection. The lowest concentration of SARS-CoV-2 viral copies this assay can detect is 138 copies/mL. A negative result does not preclude SARS-Cov-2 infection and should not be used as the sole basis for treatment or other patient management decisions. A negative result may occur with  improper specimen collection/handling, submission of specimen other than nasopharyngeal swab, presence of viral mutation(s) within the areas targeted by this assay, and inadequate number of viral copies(<138 copies/mL). A negative result must be combined with clinical observations, patient history, and epidemiological information. The expected result is Negative.  Fact Sheet for Patients:  BloggerCourse.com  Fact Sheet for Healthcare Providers:  SeriousBroker.it  This test is no t yet approved or cleared by the [place] FDA and  has been authorized for detection and/or diagnosis of SARS-CoV-2 by FDA under an Emergency Use Authorization (EUA). This EUA will remain  in effect (meaning this test can be used) for the duration of the COVID-19 declaration under Section 564(b)(1) of the Act, 21 U.S.C.section 360bbb-3(b)(1), unless the authorization is terminated  or revoked sooner.       Influenza A by PCR NEGATIVE NEGATIVE Final   Influenza B by PCR NEGATIVE NEGATIVE Final    Comment: (NOTE) The Xpert Xpress SARS-CoV-2/FLU/RSV  plus assay is intended as an aid in the diagnosis of influenza from Nasopharyngeal swab specimens and should not be used as a sole basis for treatment. Nasal washings and aspirates are unacceptable for Xpert Xpress SARS-CoV-2/FLU/RSV testing.  Fact Sheet for Patients: BloggerCourse.com  Fact Sheet for Healthcare Providers: SeriousBroker.it  This test is not yet approved or cleared by the [place] FDA and has been authorized for detection and/or diagnosis of SARS-CoV-2 by FDA under an Emergency Use Authorization (EUA). This EUA will remain in effect (meaning this test can be used) for the duration of the COVID-19 declaration under Section 564(b)(1) of the Act, 21 U.S.C. section 360bbb-3(b)(1), unless the authorization is terminated or revoked.  Performed at [facility], [address] [place], [place] [address]   Respiratory (~20 pathogens) panel by PCR     Status: None   Collection Time: 10/29/20  1:09 PM   Specimen: Nasopharyngeal Swab; Respiratory  Result Value Ref Range Status   Adenovirus NOT DETECTED NOT DETECTED Final   Coronavirus 229E NOT DETECTED NOT DETECTED Final    Comment: (NOTE) The Coronavirus on the Respiratory Panel, DOES NOT test for the novel  Coronavirus (2019 nCoV)  Coronavirus HKU1 NOT DETECTED NOT DETECTED Final   Coronavirus NL63 NOT DETECTED NOT DETECTED Final   Coronavirus OC43 NOT DETECTED NOT DETECTED Final   Metapneumovirus NOT DETECTED NOT DETECTED Final   Rhinovirus / Enterovirus NOT DETECTED NOT DETECTED Final   Influenza A NOT DETECTED NOT DETECTED Final   Influenza B NOT DETECTED NOT DETECTED Final   Parainfluenza Virus 1 NOT DETECTED NOT DETECTED Final   Parainfluenza Virus 2 NOT DETECTED NOT DETECTED Final   Parainfluenza Virus 3 NOT DETECTED NOT DETECTED Final   Parainfluenza Virus 4 NOT DETECTED NOT DETECTED Final   Respiratory Syncytial Virus NOT DETECTED NOT DETECTED  Final   Bordetella pertussis NOT DETECTED NOT DETECTED Final   Bordetella Parapertussis NOT DETECTED NOT DETECTED Final   Chlamydophila pneumoniae NOT DETECTED NOT DETECTED Final   Mycoplasma pneumoniae NOT DETECTED NOT DETECTED Final    Comment: Performed at [facility], [address], [place], [place] [address]        Radiology Studies: CT Angio Chest PE W and/or Wo Contrast  Result Date: 10/27/2020 CLINICAL DATA:  Tachy, hypoxic, chest pain Shortness of breath.  Right-sided pain.  History of COPD. EXAM: CT ANGIOGRAPHY CHEST WITH CONTRAST TECHNIQUE: Multidetector CT imaging of the chest was performed using the standard protocol during bolus administration of intravenous contrast. Multiplanar CT image reconstructions and MIPs were obtained to evaluate the vascular anatomy. CONTRAST:  69mL OMNIPAQUE IOHEXOL 350 MG/ML SOLN COMPARISON:  Radiograph earlier today.  Chest CTA 03/31/2018 FINDINGS: Cardiovascular: There are no filling defects within the pulmonary arteries to suggest pulmonary embolus. Borderline cardiomegaly. There is dilatation of the main pulmonary artery at 3.9 cm. Normal aortic caliber with mild atherosclerosis. Conventional branching pattern from the aortic arch. No pericardial effusion. Mediastinum/Nodes: Enlarged right hilar lymph nodes, largest measuring 15 mm, series 5, image 46. There multiple small mediastinal lymph nodes are not enlarged by size criteria. No left hilar adenopathy. No esophageal wall thickening. No thyroid nodule. Lungs/Pleura: Mild apical predominant emphysema. There is bronchial thickening with areas of bronchial occlusion and mucous plugging within the right greater than left lower lobe. Dependent opacity in the right lower lobe may be postobstructive. Sub solid nodularity in the left upper lobe spans approximately 15 mm, series 7, image 42. Minimal adjacent patchy airspace disease. Peribronchovascular nodularity in the right upper lobe, series 7, image  44. There is a small right pleural effusion is well as fluid tracking into the inter lobar fissure. Upper Abdomen: Low-density left adrenal thickening is similar to prior, the only partially included in the field of view. No acute upper abdominal findings. Musculoskeletal: Thoracic spondylosis with endplate spurring. There are no acute or suspicious osseous abnormalities. No chest wall soft tissue abnormality. Review of the MIP images confirms the above findings. IMPRESSION: 1. No pulmonary embolus. 2. Mild emphysema. Bronchial thickening with areas of bronchial occlusion and mucous plugging within the right greater than left lower lobe. Dependent opacity in the right lower lobe may be postobstructive. Small right pleural effusion. 3. Peribronchovascular nodularity in the right upper lobe, likely infectious or inflammatory bronchiolitis. 4. Sub solid nodularity in the left upper lobe spans 15 mm, corresponding to radiographic abnormality. This may be infectious, however recommend follow-up as a potential pulmonary nodule. CT at 3-6 months is recommended to assess for persistence. 5. Small right pleural effusion with fluid tracking into the fissure. 6. Enlarged right hilar lymph nodes are likely reactive. 7. Dilatation of the main pulmonary artery suggesting pulmonary arterial hypertension. Aortic  Atherosclerosis (ICD10-I70.0) and Emphysema (ICD10-J43.9). Electronically Signed   By: Narda RutherfordMelanie  Sanford M.D.   On: 10/27/2020 18:35        Scheduled Meds:  enoxaparin (LOVENOX) injection  40 mg Subcutaneous Daily   methylPREDNISolone (SOLU-MEDROL) injection  40 mg Intravenous Q12H   mometasone-formoterol  2 puff Inhalation BID   umeclidinium bromide  1 puff Inhalation Daily   Continuous Infusions:  azithromycin 500 mg (10/29/20 0845)   cefTRIAXone (ROCEPHIN)  IV Stopped (10/28/20 1920)     LOS: 2 days     Jacquelin Hawkingalph Nefi Musich, MD [facility] 10/29/2020, 4:41 PM  If 7PM-7AM, please contact  night-coverage [email or web address]

## 2020-10-29 NOTE — Evaluation (Signed)
Physical Therapy Evaluation Patient Details Name: Evelyn Peterson MRN: [id number] DOB: Jun 19, 1960 Today's Date: 10/29/2020   History of Present Illness  60 yo female presents to [facility] on 8/1 with COPD exacerbation. CXR shows lower lung opacities due to atelectasis vs PNA, module in L midlung. PMH includes COPD, recent covid 19 and PNA.  Clinical Impression   Pt presents with generalized weakness, dyspnea on exertion requiring 4LO2 via [place] with sats 97-98%, impaired dynamic standing balance vs baseline, and decreased activity tolerance. Pt to benefit from acute PT to address deficits. Pt ambulated hallway distance with close guard for safety, verbal cuing for rest breaks as needed given tachypnea with accompanying dizziness. PT to progress mobility as tolerated, and will continue to follow acutely.      Follow Up Recommendations Home health PT    Equipment Recommendations  None recommended by PT    Recommendations for Other Services       Precautions / Restrictions Precautions Precautions: Fall Precaution Comments: watch sats - on 4LO2 via [place] Restrictions Weight Bearing Restrictions: No      Mobility  Bed Mobility               General bed mobility comments: up in recliner    Transfers Overall transfer level: Needs assistance Equipment used: None Transfers: Sit to/from Stand Sit to Stand: Supervision         General transfer comment: for safety, slow to rise/sit  Ambulation/Gait Ambulation/Gait assistance: Min guard Gait Distance (Feet): 150 Feet Assistive device: None Gait Pattern/deviations: Decreased stride length;Step-through pattern;Shuffle Gait velocity: decr   General Gait Details: close guard for safety, slowed and effortful steps vs baseline. Standing rest break x3 to recover DOE 2/4, dizziness, RR up to 40 breaths/min, HRmax 120s. SpO2 97-98% on 4LO2 when assessed  Stairs            Wheelchair Mobility    Modified Rankin (Stroke Patients  Only)       Balance Overall balance assessment: Needs assistance Sitting-balance support: No upper extremity supported;Feet supported Sitting balance-Leahy Scale: Good     Standing balance support: No upper extremity supported Standing balance-Leahy Scale: Fair                               Pertinent Vitals/Pain Pain Assessment: Faces Faces Pain Scale: No hurt Pain Intervention(s): Monitored during session    Home Living Family/patient expects to be discharged to:: Private residence Living Arrangements: Other relatives Available Help at Discharge: Family;Available 24 hours/day Type of Home: House Home Access: Stairs to enter   [employer] of Steps: a few Home Layout: Two level Home Equipment: Grab bars - tub/shower      Prior Function Level of Independence: Independent         Comments: pt reports she works in [employer] area at [employer], states she has had breathing difficulty "for months". Wears 2LO2 at night     Hand Dominance   Dominant Hand: Right    Extremity/Trunk Assessment   Upper Extremity Assessment Upper Extremity Assessment: Defer to OT evaluation    Lower Extremity Assessment Lower Extremity Assessment: Generalized weakness    Cervical / Trunk Assessment Cervical / Trunk Assessment: Normal  Communication   Communication: No difficulties  Cognition Arousal/Alertness: Awake/alert Behavior During Therapy: WFL for tasks assessed/performed Overall Cognitive Status: Within Functional Limits for tasks assessed  General Comments General comments (skin integrity, edema, etc.): 4LO2, SpO2 97-98% mid-gait; RRmax 40 breaths/min; HRmax 120s    Exercises     Assessment/Plan    PT Assessment Patient needs continued PT services  PT Problem List Decreased strength;Decreased mobility;Decreased activity tolerance;Decreased knowledge of precautions;Decreased  balance;Cardiopulmonary status limiting activity       PT Treatment Interventions DME instruction;Therapeutic activities;Gait training;Therapeutic exercise;Balance training;Functional mobility training;Stair training;Patient/family education;Neuromuscular re-education    PT Goals (Current goals can be found in the Care Plan section)  Acute Rehab PT Goals Patient Stated Goal: breathe better PT Goal Formulation: With patient Time For Goal Achievement: 11/12/20 Potential to Achieve Goals: Good    Frequency Min 3X/week   Barriers to discharge        Co-evaluation               AM-PAC PT "6 Clicks" Mobility  Outcome Measure Help needed turning from your back to your side while in a flat bed without using bedrails?: A Little Help needed moving from lying on your back to sitting on the side of a flat bed without using bedrails?: A Little Help needed moving to and from a bed to a chair (including a wheelchair)?: A Little Help needed standing up from a chair using your arms (e.g., wheelchair or bedside chair)?: A Little Help needed to walk in hospital room?: A Little Help needed climbing 3-5 steps with a railing? : A Little 6 Click Score: 18    End of Session Equipment Utilized During Treatment: Oxygen Activity Tolerance: Patient tolerated treatment well Patient left: in chair;with call bell/phone within reach Nurse Communication: Mobility status PT Visit Diagnosis: Other abnormalities of gait and mobility (R26.89)    Time: 3016-0109 PT Time Calculation (min) (ACUTE ONLY): 17 min   Charges:   PT Evaluation $PT Eval Low Complexity: 1 Low         [place] S, PT DPT Acute Rehabilitation Services Pager [phone]  Office [phone]   Tyrone Apple E Christain [place] 10/29/2020, 4:18 PM

## 2020-10-30 ENCOUNTER — Other Ambulatory Visit (HOSPITAL_COMMUNITY): Payer: Self-pay

## 2020-10-30 LAB — CBC
HCT: 47.5 % — ABNORMAL HIGH (ref 36.0–46.0)
Hemoglobin: 14.7 g/dL (ref 12.0–15.0)
MCH: 32.4 pg (ref 26.0–34.0)
MCHC: 30.9 g/dL (ref 30.0–36.0)
MCV: 104.6 fL — ABNORMAL HIGH (ref 80.0–100.0)
Platelets: 278 10*3/uL (ref 150–400)
RBC: 4.54 MIL/uL (ref 3.87–5.11)
RDW: 13.5 % (ref 11.5–15.5)
WBC: 10.7 10*3/uL — ABNORMAL HIGH (ref 4.0–10.5)
nRBC: 0 % (ref 0.0–0.2)

## 2020-10-30 MED ORDER — AMOXICILLIN-POT CLAVULANATE 500-125 MG PO TABS
1.0000 | ORAL_TABLET | Freq: Three times a day (TID) | ORAL | 0 refills | Status: AC
  Filled 2020-10-30: qty 12, 4d supply, fill #0

## 2020-10-30 MED ORDER — AZITHROMYCIN 500 MG PO TABS
500.0000 mg | ORAL_TABLET | Freq: Every day | ORAL | 0 refills | Status: AC
  Filled 2020-10-30: qty 1, 1d supply, fill #0

## 2020-10-30 MED ORDER — GUAIFENESIN ER 600 MG PO TB12
1200.0000 mg | ORAL_TABLET | Freq: Two times a day (BID) | ORAL | 0 refills | Status: AC
  Filled 2020-10-30: qty 20, 5d supply, fill #0

## 2020-10-30 NOTE — Evaluation (Signed)
Occupational Therapy Evaluation Patient Details Name: Evelyn Peterson MRN: [id number] DOB: Jan 07, 1961 Today's Date: 10/30/2020    History of Present Illness 60 yo female presents to [facility] on 8/1 with COPD exacerbation. CXR shows lower lung opacities due to atelectasis vs PNA, module in L midlung. PMH includes COPD, recent covid 19 and PNA.   Clinical Impression   PTA patient was living with family in a private residence and was grossly I with ADLs/IADLs without AD. 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 Patient currently functioning near baseline demonstrating observed ADLs including LB dressing, grooming standing at sink level, toileitng, and functional mobility 300+ft with I. Patient able to navigate portable O2 tank in hallway independently. Written and verbal education provided on energy conservation in prep for safe d/c home. Patient does not require continued acute occupational therapy services at this time with OT to sign off. Encouraged functional mobility in hallways independently. RN aware and in agreement.     Follow Up Recommendations  No OT follow up    Equipment Recommendations  None recommended by OT    Recommendations for Other Services       Precautions / Restrictions Precautions Precautions: Fall Precaution Comments: watch sats - on 4LO2 via [place] Restrictions Weight Bearing Restrictions: No      Mobility Bed Mobility               General bed mobility comments: Seated in recliner upon entry.    Transfers Overall transfer level: Independent                    Balance Overall balance assessment: Independent                                         ADL either performed or assessed with clinical judgement   ADL Overall ADL's : Independent                                       General ADL Comments: I with ADLs/IADLs; works 25hrs/wk at a hotel; 2L O2 at night     Vision  Baseline Vision/History: Wears glasses Wears Glasses: Reading only Patient Visual Report: No change from baseline Vision Assessment?: No apparent visual deficits     Perception     Praxis      Pertinent Vitals/Pain Pain Assessment: No/denies pain     Hand Dominance Right   Extremity/Trunk Assessment Upper Extremity Assessment Upper Extremity Assessment: Overall WFL for tasks assessed       Cervical / Trunk Assessment Cervical / Trunk Assessment: Normal   Communication Communication Communication: No difficulties   Cognition Arousal/Alertness: Awake/alert Behavior During Therapy: WFL for tasks assessed/performed Overall Cognitive Status: Within Functional Limits for tasks assessed                                     General Comments  SpO2 86% on RA upon entry, rebounded to 90% with 4L replaced, HR 81bpm. With mobility SpO2 93-95% on 4L, HR remained in 80's.    Exercises     Shoulder Instructions      Home Living Family/patient expects to be discharged to:: Private residence Living Arrangements: Other  relatives Available Help at Discharge: Family;Available 24 hours/day Type of Home: House Home Access: Stairs to enter [employer] of Steps: a few   Home Layout: Two level     Bathroom Shower/Tub: Chief Strategy Officer: Standard     Home Equipment: Grab bars - tub/shower          Prior Functioning/Environment Level of Independence: Independent        Comments: pt reports she works in [employer] area at [employer], states she has had breathing difficulty "for months". Wears 2LO2 at night        OT Problem List:        OT Treatment/Interventions:      OT Goals(Current goals can be found in the care plan section) Acute Rehab OT Goals Patient Stated Goal: To return home. OT Goal Formulation: With patient  OT Frequency:     Barriers to D/C:            Co-evaluation              AM-PAC OT  "6 Clicks" Daily Activity     Outcome Measure Help from another person eating meals?: None Help from another person taking care of personal grooming?: None Help from another person toileting, which includes using toliet, bedpan, or urinal?: None Help from another person bathing (including washing, rinsing, drying)?: None Help from another person to put on and taking off regular upper body clothing?: None Help from another person to put on and taking off regular lower body clothing?: None 6 Click Score: 24   End of Session Equipment Utilized During Treatment: Gait belt;Oxygen (4L via ) Nurse Communication: Mobility status;Other (comment) (Patient desire for shower; Ok to be independent in room)  Activity Tolerance: Patient tolerated treatment well Patient left: in chair;with call bell/phone within reach  OT Visit Diagnosis: Muscle weakness (generalized) (M62.81)                Time: 1660-6301 OT Time Calculation (min): 18 min Charges:  OT General Charges $OT Visit: 1 Visit OT Evaluation $OT Eval Low Complexity: 1 Low  Nayzeth Altman H. OTR/L Supplemental OT, Department of rehab services [phone]  Ailee Pates R H. 10/30/2020, 8:02 AM

## 2020-10-30 NOTE — Progress Notes (Signed)
   10/30/20 1151  Clinical Encounter Type  Visited With Patient  Visit Type Initial  Referral From Nurse  Consult/Referral To Chaplain   Chaplain responded to the consult request for an Advance Directive. The chaplain did education on the AD. The patient said she wants her brother to be her HCPOA. She said she would be discharged today and would have it notarized outside the hospital. This note was prepared by Deneen Harts, M.Div..  For questions please contact by phone [phone].

## 2020-10-30 NOTE — Discharge Summary (Signed)
Physician Discharge Summary  Evelyn Peterson ZOX:[id number] DOB: 1960/10/16 DOA: 10/27/2020  PCP: Evelyn Rigg, NP  Admit date: 10/27/2020 Discharge date: 10/30/2020  Admitted From: Home Disposition: Home  Recommendations for Outpatient Follow-up:  Follow up with PCP in 1 week Repeat CT in 3-6 months for left upper lobe nodularity Please obtain BMP/CBC in one week Please follow up on the following pending results: None  Home Health: PT Equipment/Devices: None  Discharge Condition: Stable CODE STATUS: Full code Diet recommendation: Regular diet   Brief/Interim Summary:  Admission HPI written by Evelyn Adam, MD   HPI: Evelyn Peterson is a 60 y.o. female with medical history significant for COPD who presented by EMS with complaint of shortness of breath.  She reports that earlier today she had some sharp right lateral chest wall pain associated with shortness of breath.  She reports she was not doing any activity at the time that the symptoms started.  She reports the pain did not radiate but became severe at a 8 out of 10 she states and was on the right lateral chest wall just below her armpit.  She reports she did not have any nausea or vomiting.  She denies breaking out into a cold sweat with the shortness of breath and chest wall pain.  He denies any injury or trauma.  She reports that 3 to 4 months ago she had COVID and then developed pneumonia and bronchitis.  She was treated a few months ago with an antibiotic for pneumonia but she does not member which one. She was also treated with prednisone for bronchitis a few weeks ago.  When EMS evaluated her she was found to have an O2 sat of 66% on room air was placed on a nonrebreather at 15 L.  She has now weaned to a nasal cannula at 3 L/min. She reports that she uses oxygen by nasal cannula when she is sleeping but not during the day.  She denies having any fever but has had some chills and has had decreased energy level  today.  She denies any exposure to new fumes, chemicals, soaps, detergents at home.  She has not had any recent travel.  She reports that her sister and nieces and nephews who she lives with have had recent upper respiratory tract symptoms.  She has a history of smoking but is trying to quit and states she is only smoked 3 to 4 cigarettes in the last 1 to 2 weeks.  Denies alcohol or illicit drug use.   Peterson course:  Community acquired pneumonia Imaging concerning for infectious/inflammatory bronchiolitis. Mucous plugging noted. Started on Ceftriaxone and azithromycin with improvement of symptoms. Respiratory virus panel negative. Discharge on Augmentin and azithromycin.   COPD exacerbation Patient treated with solumedrol and Duonebs. Home Incruse Ellipta continued and home Symbicort substituted for Evelyn Peterson inpatient. Also treated with solu-medrol. Resolved prior to discharge.   Acute on chronic respiratory failure with hypoxia Secondary to above. Patient is on 2 Lpm as an outpatient.   Left upper lobe nodularity Possibly infectious however recommendation is for repeat CT in 3-6 months.  Discharge Diagnoses:  Principal Problem:   CAP (community acquired pneumonia) Active Problems:   COPD with acute exacerbation (HCC)   Tobacco use   Acute respiratory failure with hypoxia and hypercapnia (HCC)    Discharge Instructions   Allergies as of 10/30/2020   No Known Allergies      Medication List     STOP  taking these medications    predniSONE 20 MG tablet Commonly known as: DELTASONE       TAKE these medications    albuterol 108 (90 Base) MCG/ACT inhaler Commonly known as: VENTOLIN HFA Inhale 2 puffs into the lungs every 6 (six) hours as needed for wheezing or shortness of breath.   amoxicillin-clavulanate 500-125 MG tablet Commonly known as: Augmentin Take 1 tablet (500 mg total) by mouth 3 (three) times daily for 4 days.   azithromycin 500 MG tablet Commonly known  as: Zithromax Take 1 tablet (500 mg total) by mouth daily for 1 day. Start taking on: October 31, 2020 What changed:  medication strength how much to take how to take this when to take this additional instructions   budesonide-formoterol 160-4.5 MCG/ACT inhaler Commonly known as: Symbicort Inhale 2 puffs into the lungs 2 (two) times daily.   guaiFENesin 600 MG 12 hr tablet Commonly known as: MUCINEX Take 2 tablets (1,200 mg total) by mouth 2 (two) times daily for 5 days.   Incruse Ellipta 62.5 MCG/INH Aepb Generic drug: umeclidinium bromide Inhale 1 puff into the lungs daily.       ASK your doctor about these medications    Chantix Starting Month Pak 0.5 MG X 11 & 1 MG X 42 tablet Generic drug: varenicline Take one 0.5 mg tablet by mouth once a day x 3 days, increase to one 0.5 mg tab twice a day x 4 days, increase to one 1 mg tab twice a day        Follow-up Information     Evelyn Rigg, NP. Schedule an appointment as soon as possible for a visit in 1 week(s).   Specialty: Nurse Practitioner Why: For Peterson follow-up Contact information: [address] [place] [address] [phone]                No Known Allergies  Consultations: None   Procedures/Studies: CT Angio Chest PE W and/or Wo Contrast  Result Date: 10/27/2020 CLINICAL DATA:  Tachy, hypoxic, chest pain Shortness of breath.  Right-sided pain.  History of COPD. EXAM: CT ANGIOGRAPHY CHEST WITH CONTRAST TECHNIQUE: Multidetector CT imaging of the chest was performed using the standard protocol during bolus administration of intravenous contrast. Multiplanar CT image reconstructions and MIPs were obtained to evaluate the vascular anatomy. CONTRAST:  75mL OMNIPAQUE IOHEXOL 350 MG/ML SOLN COMPARISON:  Radiograph earlier today.  Chest CTA 03/31/2018 FINDINGS: Cardiovascular: There are no filling defects within the pulmonary arteries to suggest pulmonary embolus. Borderline cardiomegaly. There  is dilatation of the main pulmonary artery at 3.9 cm. Normal aortic caliber with mild atherosclerosis. Conventional branching pattern from the aortic arch. No pericardial effusion. Mediastinum/Nodes: Enlarged right hilar lymph nodes, largest measuring 15 mm, series 5, image 46. There multiple small mediastinal lymph nodes are not enlarged by size criteria. No left hilar adenopathy. No esophageal wall thickening. No thyroid nodule. Lungs/Pleura: Mild apical predominant emphysema. There is bronchial thickening with areas of bronchial occlusion and mucous plugging within the right greater than left lower lobe. Dependent opacity in the right lower lobe may be postobstructive. Sub solid nodularity in the left upper lobe spans approximately 15 mm, series 7, image 42. Minimal adjacent patchy airspace disease. Peribronchovascular nodularity in the right upper lobe, series 7, image 44. There is a small right pleural effusion is well as fluid tracking into the inter lobar fissure. Upper Abdomen: Low-density left adrenal thickening is similar to prior, the only partially included in the field of  view. No acute upper abdominal findings. Musculoskeletal: Thoracic spondylosis with endplate spurring. There are no acute or suspicious osseous abnormalities. No chest wall soft tissue abnormality. Review of the MIP images confirms the above findings. IMPRESSION: 1. No pulmonary embolus. 2. Mild emphysema. Bronchial thickening with areas of bronchial occlusion and mucous plugging within the right greater than left lower lobe. Dependent opacity in the right lower lobe may be postobstructive. Small right pleural effusion. 3. Peribronchovascular nodularity in the right upper lobe, likely infectious or inflammatory bronchiolitis. 4. Sub solid nodularity in the left upper lobe spans 15 mm, corresponding to radiographic abnormality. This may be infectious, however recommend follow-up as a potential pulmonary nodule. CT at 3-6 months is  recommended to assess for persistence. 5. Small right pleural effusion with fluid tracking into the fissure. 6. Enlarged right hilar lymph nodes are likely reactive. 7. Dilatation of the main pulmonary artery suggesting pulmonary arterial hypertension. Aortic Atherosclerosis (ICD10-I70.0) and Emphysema (ICD10-J43.9). Electronically Signed   By: Narda Rutherford M.D.   On: 10/27/2020 18:35   DG Chest Portable 1 View  Result Date: 10/27/2020 CLINICAL DATA:  Shortness of breath. Onset posterior back pain on the right when coughing 2 days ago. EXAM: PORTABLE CHEST 1 VIEW COMPARISON:  PA and lateral chest 03/28/2018.  CT chest 03/31/2018. FINDINGS: There is cardiomegaly without edema. Aortic atherosclerosis. Hazy opacities are seen in the lower lung zones bilaterally. A more focal nodular opacity is seen in the left mid lung. No pneumothorax or pleural fluid. No acute or focal bony abnormality. IMPRESSION: Hazy opacities in the lower lung zones could be due to atelectasis or pneumonia. Nodular opacity in the left mid lung cannot be characterized. CT chest with contrast is recommended for further evaluation. Cardiomegaly without edema. Aortic Atherosclerosis (ICD10-I70.0). Electronically Signed   By: Drusilla Kanner M.D.   On: 10/27/2020 14:01      Subjective: Breathing better. Some mild dyspnea with exertion when working with PT  Discharge Exam: Vitals:   10/29/20 1959 10/30/20 0732  BP: (!) 156/65   Pulse: 80   Resp:    Temp: 98.2 F (36.8 C)   SpO2: 92% 93%   Vitals:   10/29/20 1230 10/29/20 1953 10/29/20 1959 10/30/20 0732  BP: 140/68  (!) 156/65   Pulse: 77  80   Resp: 17     Temp: 98.1 F (36.7 C)  98.2 F (36.8 C)   TempSrc: Oral  Oral   SpO2: 95% 95% 92% 93%  Weight:      Height:        General: Pt is alert, awake, not in acute distress Cardiovascular: RRR, S1/S2 +, no rubs, no gallops Respiratory: Diminished, no wheezing, no rhonchi Abdominal: Soft, NT, ND, bowel sounds  + Extremities: no edema, no cyanosis    The results of significant diagnostics from this hospitalization (including imaging, microbiology, ancillary and laboratory) are listed below for reference.     Microbiology: Recent Results (from the past 240 hour(s))  Resp Panel by RT-PCR (Flu A&B, Covid) Nasopharyngeal Swab     Status: None   Collection Time: 10/27/20  1:05 PM   Specimen: Nasopharyngeal Swab; Nasopharyngeal(NP) swabs in vial transport medium  Result Value Ref Range Status   SARS Coronavirus 2 by RT PCR NEGATIVE NEGATIVE Final    Comment: (NOTE) SARS-CoV-2 target nucleic acids are NOT DETECTED.  The SARS-CoV-2 RNA is generally detectable in upper respiratory specimens during the acute phase of infection. The lowest concentration of SARS-CoV-2 viral copies this assay  can detect is 138 copies/mL. A negative result does not preclude SARS-Cov-2 infection and should not be used as the sole basis for treatment or other patient management decisions. A negative result may occur with  improper specimen collection/handling, submission of specimen other than nasopharyngeal swab, presence of viral mutation(s) within the areas targeted by this assay, and inadequate number of viral copies(<138 copies/mL). A negative result must be combined with clinical observations, patient history, and epidemiological information. The expected result is Negative.  Fact Sheet for Patients:  BloggerCourse.com  Fact Sheet for Healthcare Providers:  SeriousBroker.it  This test is no t yet approved or cleared by the [place] FDA and  has been authorized for detection and/or diagnosis of SARS-CoV-2 by FDA under an Emergency Use Authorization (EUA). This EUA will remain  in effect (meaning this test can be used) for the duration of the COVID-19 declaration under Section 564(b)(1) of the Act, 21 U.S.C.section 360bbb-3(b)(1), unless the authorization  is terminated  or revoked sooner.       Influenza A by PCR NEGATIVE NEGATIVE Final   Influenza B by PCR NEGATIVE NEGATIVE Final    Comment: (NOTE) The Xpert Xpress SARS-CoV-2/FLU/RSV plus assay is intended as an aid in the diagnosis of influenza from Nasopharyngeal swab specimens and should not be used as a sole basis for treatment. Nasal washings and aspirates are unacceptable for Xpert Xpress SARS-CoV-2/FLU/RSV testing.  Fact Sheet for Patients: BloggerCourse.com  Fact Sheet for Healthcare Providers: SeriousBroker.it  This test is not yet approved or cleared by the [place] FDA and has been authorized for detection and/or diagnosis of SARS-CoV-2 by FDA under an Emergency Use Authorization (EUA). This EUA will remain in effect (meaning this test can be used) for the duration of the COVID-19 declaration under Section 564(b)(1) of the Act, 21 U.S.C. section 360bbb-3(b)(1), unless the authorization is terminated or revoked.  Performed at [facility], [address], [place], [place] [address]   Respiratory (~20 pathogens) panel by PCR     Status: None   Collection Time: 10/29/20  1:09 PM   Specimen: Nasopharyngeal Swab; Respiratory  Result Value Ref Range Status   Adenovirus NOT DETECTED NOT DETECTED Final   Coronavirus 229E NOT DETECTED NOT DETECTED Final    Comment: (NOTE) The Coronavirus on the Respiratory Panel, DOES NOT test for the novel  Coronavirus (2019 nCoV)    Coronavirus HKU1 NOT DETECTED NOT DETECTED Final   Coronavirus NL63 NOT DETECTED NOT DETECTED Final   Coronavirus OC43 NOT DETECTED NOT DETECTED Final   Metapneumovirus NOT DETECTED NOT DETECTED Final   Rhinovirus / Enterovirus NOT DETECTED NOT DETECTED Final   Influenza A NOT DETECTED NOT DETECTED Final   Influenza B NOT DETECTED NOT DETECTED Final   Parainfluenza Virus 1 NOT DETECTED NOT DETECTED Final   Parainfluenza Virus 2 NOT DETECTED  NOT DETECTED Final   Parainfluenza Virus 3 NOT DETECTED NOT DETECTED Final   Parainfluenza Virus 4 NOT DETECTED NOT DETECTED Final   Respiratory Syncytial Virus NOT DETECTED NOT DETECTED Final   Bordetella pertussis NOT DETECTED NOT DETECTED Final   Bordetella Parapertussis NOT DETECTED NOT DETECTED Final   Chlamydophila pneumoniae NOT DETECTED NOT DETECTED Final   Mycoplasma pneumoniae NOT DETECTED NOT DETECTED Final    Comment: Performed at [facility], [address] [place], [place] [address]     Labs: BNP (last 3 results) No results for input(s): BNP in the last 8760 hours. Basic Metabolic Panel: Recent Labs  Lab 10/27/20 1306 10/27/20 1314 10/27/20 1931 10/28/20 0454 10/29/20 0310  NA 140 139 136 139 140  K 3.9 3.8 4.0 4.8 4.8  CL 93*  --   --  95* 96*  CO2 36*  --   --  38* 38*  GLUCOSE 118*  --   --  160* 161*  BUN 6*  --   --  8 13  CREATININE 0.64  --   --  0.52 0.63  CALCIUM 9.2  --   --  8.9 8.8*   Liver Function Tests: Recent Labs  Lab 10/29/20 0310  AST 19  ALT 34  ALKPHOS 73  BILITOT 0.7  PROT 6.0*  ALBUMIN 2.4*   No results for input(s): LIPASE, AMYLASE in the last 168 hours. No results for input(s): AMMONIA in the last 168 hours. CBC: Recent Labs  Lab 10/27/20 1306 10/27/20 1314 10/27/20 1931 10/28/20 0454 10/29/20 0310 10/30/20 1010  WBC 12.5*  --   --  10.1 14.6* 10.7*  HGB 15.7* 17.3* 16.0* 14.6 13.6 14.7  HCT 51.6* 51.0* 47.0* 47.7* 44.7 47.5*  MCV 104.2*  --   --  104.6* 104.4* 104.6*  PLT 305  --   --  272 270 278   Cardiac Enzymes: No results for input(s): CKTOTAL, CKMB, CKMBINDEX, TROPONINI in the last 168 hours. BNP: Invalid input(s): POCBNP CBG: No results for input(s): GLUCAP in the last 168 hours. D-Dimer No results for input(s): DDIMER in the last 72 hours. Hgb A1c No results for input(s): HGBA1C in the last 72 hours. Lipid Profile No results for input(s): CHOL, HDL, LDLCALC, TRIG, CHOLHDL, LDLDIRECT in the  last 72 hours. Thyroid function studies No results for input(s): TSH, T4TOTAL, T3FREE, THYROIDAB in the last 72 hours.  Invalid input(s): FREET3 Anemia work up No results for input(s): VITAMINB12, FOLATE, FERRITIN, TIBC, IRON, RETICCTPCT in the last 72 hours. Urinalysis    Component Value Date/Time   COLORURINE YELLOW 03/29/2018 0514   APPEARANCEUR CLEAR 03/29/2018 0514   LABSPEC 1.011 03/29/2018 0514   PHURINE 7.0 03/29/2018 0514   GLUCOSEU NEGATIVE 03/29/2018 0514   HGBUR NEGATIVE 03/29/2018 0514   BILIRUBINUR NEGATIVE 03/29/2018 0514   KETONESUR NEGATIVE 03/29/2018 0514   PROTEINUR NEGATIVE 03/29/2018 0514   NITRITE NEGATIVE 03/29/2018 0514   LEUKOCYTESUR NEGATIVE 03/29/2018 0514   Sepsis Labs Invalid input(s): PROCALCITONIN,  WBC,  LACTICIDVEN Microbiology Recent Results (from the past 240 hour(s))  Resp Panel by RT-PCR (Flu A&B, Covid) Nasopharyngeal Swab     Status: None   Collection Time: 10/27/20  1:05 PM   Specimen: Nasopharyngeal Swab; Nasopharyngeal(NP) swabs in vial transport medium  Result Value Ref Range Status   SARS Coronavirus 2 by RT PCR NEGATIVE NEGATIVE Final    Comment: (NOTE) SARS-CoV-2 target nucleic acids are NOT DETECTED.  The SARS-CoV-2 RNA is generally detectable in upper respiratory specimens during the acute phase of infection. The lowest concentration of SARS-CoV-2 viral copies this assay can detect is 138 copies/mL. A negative result does not preclude SARS-Cov-2 infection and should not be used as the sole basis for treatment or other patient management decisions. A negative result may occur with  improper specimen collection/handling, submission of specimen other than nasopharyngeal swab, presence of viral mutation(s) within the areas targeted by this assay, and inadequate number of viral copies(<138 copies/mL). A negative result must be combined with clinical observations, patient history, and epidemiological information. The expected  result is Negative.  Fact Sheet for Patients:  BloggerCourse.comhttps://www.fda.gov/media/152166/download  Fact Sheet for  Healthcare Providers:  SeriousBroker.it  This test is no t yet approved or cleared by the [place] and  has been authorized for detection and/or diagnosis of SARS-CoV-2 by FDA under an Emergency Use Authorization (EUA). This EUA will remain  in effect (meaning this test can be used) for the duration of the COVID-19 declaration under Section 564(b)(1) of the Act, 21 U.S.C.section 360bbb-3(b)(1), unless the authorization is terminated  or revoked sooner.       Influenza A by PCR NEGATIVE NEGATIVE Final   Influenza B by PCR NEGATIVE NEGATIVE Final    Comment: (NOTE) The Xpert Xpress SARS-CoV-2/FLU/RSV plus assay is intended as an aid in the diagnosis of influenza from Nasopharyngeal swab specimens and should not be used as a sole basis for treatment. Nasal washings and aspirates are unacceptable for Xpert Xpress SARS-CoV-2/FLU/RSV testing.  Fact Sheet for Patients: BloggerCourse.com  Fact Sheet for Healthcare Providers: SeriousBroker.it  This test is not yet approved or cleared by the [place] FDA and has been authorized for detection and/or diagnosis of SARS-CoV-2 by FDA under an Emergency Use Authorization (EUA). This EUA will remain in effect (meaning this test can be used) for the duration of the COVID-19 declaration under Section 564(b)(1) of the Act, 21 U.S.C. section 360bbb-3(b)(1), unless the authorization is terminated or revoked.  Performed at [facility], [address], [place], [place] [address]   Respiratory (~20 pathogens) panel by PCR     Status: None   Collection Time: 10/29/20  1:09 PM   Specimen: Nasopharyngeal Swab; Respiratory  Result Value Ref Range Status   Adenovirus NOT DETECTED NOT DETECTED Final   Coronavirus 229E NOT DETECTED NOT DETECTED Final     Comment: (NOTE) The Coronavirus on the Respiratory Panel, DOES NOT test for the novel  Coronavirus (2019 nCoV)    Coronavirus HKU1 NOT DETECTED NOT DETECTED Final   Coronavirus NL63 NOT DETECTED NOT DETECTED Final   Coronavirus OC43 NOT DETECTED NOT DETECTED Final   Metapneumovirus NOT DETECTED NOT DETECTED Final   Rhinovirus / Enterovirus NOT DETECTED NOT DETECTED Final   Influenza A NOT DETECTED NOT DETECTED Final   Influenza B NOT DETECTED NOT DETECTED Final   Parainfluenza Virus 1 NOT DETECTED NOT DETECTED Final   Parainfluenza Virus 2 NOT DETECTED NOT DETECTED Final   Parainfluenza Virus 3 NOT DETECTED NOT DETECTED Final   Parainfluenza Virus 4 NOT DETECTED NOT DETECTED Final   Respiratory Syncytial Virus NOT DETECTED NOT DETECTED Final   Bordetella pertussis NOT DETECTED NOT DETECTED Final   Bordetella Parapertussis NOT DETECTED NOT DETECTED Final   Chlamydophila pneumoniae NOT DETECTED NOT DETECTED Final   Mycoplasma pneumoniae NOT DETECTED NOT DETECTED Final    Comment: Performed at [facility], [address], [place], [place] [address]     Time coordinating discharge: 35 minutes  SIGNED:   Jacquelin Hawking, MD [facility] 10/30/2020, 2:07 PM

## 2020-10-30 NOTE — Progress Notes (Signed)
SATURATION QUALIFICATIONS: (This note is used to comply with regulatory documentation for home oxygen)  Patient Saturations on 2L at Rest = 95%   Patient Saturations on 2 Liters of oxygen while Ambulating = 92%  Please briefly explain why patient needs home oxygen:  Pt remains on 2L of O2 which is her baseline.

## 2020-10-30 NOTE — TOC Transition Note (Addendum)
Transition of Care [facility]) - CM/SW Discharge Note   Patient Details  Name: Evelyn Peterson MRN: [id number] Date of Birth: [birth year]  [facility] CM/SW Contact:  Lockie Pares, RN Phone Number: 10/30/2020, 12:38 PM   Clinical Narrative:    Spoke to patient on phone regarding [facility] services and needs at home. She has not had HH in the past, has oxygen at home, but cannot remember the name of the oxygen.. provider. Is open to [facility], and wants a company that works best with her Insurance.  Stacy from [facility] contacted and will accept for PT order in the system.    Final next level of care: Home w Home Health Services     Patient Goals and CMS Choice        Discharge Placement            Home with home health           Discharge Plan and Services   Discharge Planning Services: CM Consult Post Acute Care Choice: Home Health                    HH Arranged: PT          Social Determinants of Health (SDOH) Interventions     Readmission Risk Interventions No flowsheet data found.

## 2020-10-30 NOTE — Discharge Instructions (Signed)
Evelyn Peterson,  You were in the hospital for the following major issues:  Community acquired pneumonia:  you were treated with antibiotics and will discharge on antibiotics. This appears to be improved. You will also discharge with home health and your baseline oxygen.   COPD exacerbation: You were treated with steroids initially but this was discontinued because of quick improvement. Continue your home medications.  Left upper lobe nodularity:  Please ensure you/your PCP obtain a repeat CT in 3-6 months  Jacquelin Hawking, MD

## 2020-10-31 ENCOUNTER — Telehealth: Payer: Self-pay

## 2020-10-31 NOTE — Telephone Encounter (Signed)
  Transition Care Management Follow-up Telephone Call   Date of discharge and from where:[facility] on 10/30/2020 How have you been since you were released from the hospital? Better  Any questions or concerns? No questions/concerns reported.  Items Reviewed: Did the pt receive and understand the discharge instructions provided? have the instructions and have no questions.  Medications obtained and verified? She said that she have the medication list  and the hospital staff reviewed them prior to discharge. She said that he has all of the medications/   Any new allergies since your discharge? None reported  Do you have support at home? Yes, family Other (ie: DME, Home Health, etc)    pt has a O2 concentrator at home, does not have portable one , requested if possible to get one in the future   Functional Questionnaire: (I = Independent and D = Dependent) ADL's:  Independent.        Follow up appointments reviewed:   PCP Hospital f/u appt confirmed?  Bertram Denver NP on 11/24/2020 Specialist Hospital f/u appt confirmed? None scheduled at this time  Are transportation arrangements needed? have transportation   If their condition worsens, is the pt aware to call  their PCP or go to the ED? Yes.Made pt aware if condition worsen or start experiencing rapid weight gain, chest pain, diff breathing, SOB, high fevers, or bleading to refer imediately to ED for further evaluation.  Was the patient provided with contact information for the PCP's office or ED? Pt  has the phone number  Was the pt encouraged to call back with questions or concerns?yes

## 2020-11-03 ENCOUNTER — Telehealth: Payer: Self-pay | Admitting: Nurse Practitioner

## 2020-11-03 NOTE — Telephone Encounter (Signed)
Home Health Verbal Orders - Caller/Agency: [facility]  Callback Number: [phone] Requesting OT/PT/Skilled Nursing/Social Work/Speech Therapy: PT  Frequency:  1 w 7

## 2020-11-05 NOTE — Telephone Encounter (Signed)
Verbal orders has been given for patient. 

## 2020-11-21 ENCOUNTER — Inpatient Hospital Stay: Payer: No Typology Code available for payment source | Admitting: Nurse Practitioner

## 2020-11-24 ENCOUNTER — Inpatient Hospital Stay: Payer: No Typology Code available for payment source | Admitting: Nurse Practitioner

## 2020-12-05 ENCOUNTER — Other Ambulatory Visit (HOSPITAL_COMMUNITY): Payer: Self-pay

## 2020-12-09 ENCOUNTER — Other Ambulatory Visit (HOSPITAL_COMMUNITY): Payer: Self-pay

## 2020-12-31 ENCOUNTER — Inpatient Hospital Stay: Payer: No Typology Code available for payment source | Admitting: Nurse Practitioner

## 2021-01-07 ENCOUNTER — Ambulatory Visit: Payer: 59 | Attending: Nurse Practitioner | Admitting: Nurse Practitioner

## 2021-01-07 ENCOUNTER — Other Ambulatory Visit: Payer: Self-pay

## 2021-01-07 ENCOUNTER — Encounter: Payer: Self-pay | Admitting: Nurse Practitioner

## 2021-01-07 VITALS — BP 139/86 | HR 94 | Ht 68.0 in | Wt 255.5 lb

## 2021-01-07 DIAGNOSIS — R911 Solitary pulmonary nodule: Secondary | ICD-10-CM

## 2021-01-07 DIAGNOSIS — J449 Chronic obstructive pulmonary disease, unspecified: Secondary | ICD-10-CM | POA: Diagnosis not present

## 2021-01-07 DIAGNOSIS — D72829 Elevated white blood cell count, unspecified: Secondary | ICD-10-CM

## 2021-01-07 DIAGNOSIS — Z720 Tobacco use: Secondary | ICD-10-CM

## 2021-01-07 DIAGNOSIS — F17210 Nicotine dependence, cigarettes, uncomplicated: Secondary | ICD-10-CM

## 2021-01-07 DIAGNOSIS — Z23 Encounter for immunization: Secondary | ICD-10-CM

## 2021-01-07 DIAGNOSIS — Z09 Encounter for follow-up examination after completed treatment for conditions other than malignant neoplasm: Secondary | ICD-10-CM

## 2021-01-07 DIAGNOSIS — Z1211 Encounter for screening for malignant neoplasm of colon: Secondary | ICD-10-CM

## 2021-01-07 DIAGNOSIS — J9611 Chronic respiratory failure with hypoxia: Secondary | ICD-10-CM

## 2021-01-07 DIAGNOSIS — R7303 Prediabetes: Secondary | ICD-10-CM

## 2021-01-07 MED ORDER — ALBUTEROL SULFATE HFA 108 (90 BASE) MCG/ACT IN AERS
2.0000 | INHALATION_SPRAY | Freq: Four times a day (QID) | RESPIRATORY_TRACT | 1 refills | Status: DC | PRN
  Filled 2021-01-07 – 2021-01-14 (×2): qty 18, 25d supply, fill #0

## 2021-01-07 MED ORDER — VARENICLINE TARTRATE 0.5 MG X 11 & 1 MG X 42 PO MISC
ORAL | 0 refills | Status: DC
  Filled 2021-01-07: qty 53, fill #0

## 2021-01-07 MED ORDER — BUDESONIDE-FORMOTEROL FUMARATE 160-4.5 MCG/ACT IN AERO
2.0000 | INHALATION_SPRAY | Freq: Two times a day (BID) | RESPIRATORY_TRACT | 3 refills | Status: DC
  Filled 2021-01-07: qty 10.2, 30d supply, fill #0

## 2021-01-07 MED ORDER — ALBUTEROL SULFATE (2.5 MG/3ML) 0.083% IN NEBU
2.5000 mg | INHALATION_SOLUTION | Freq: Four times a day (QID) | RESPIRATORY_TRACT | 1 refills | Status: DC | PRN
  Filled 2021-01-07 – 2021-01-14 (×2): qty 150, 13d supply, fill #0

## 2021-01-07 MED ORDER — INCRUSE ELLIPTA 62.5 MCG/INH IN AEPB
1.0000 | INHALATION_SPRAY | Freq: Every day | RESPIRATORY_TRACT | 6 refills | Status: AC
  Filled 2021-01-07: qty 30, 30d supply, fill #0

## 2021-01-07 NOTE — Progress Notes (Signed)
Assessment & Plan:  Evelyn Peterson was seen today for hospitalization follow-up.  Diagnoses and all orders for this visit:  COPD (chronic obstructive pulmonary disease) with chronic bronchitis (HCC) -     budesonide-formoterol (SYMBICORT) 160-4.5 MCG/ACT inhaler; Inhale 2 puffs into the lungs 2 (two) times daily. NEEDS PASS -     umeclidinium bromide (INCRUSE ELLIPTA) 62.5 MCG/INH AEPB; Inhale 1 puff into the lungs daily. -     albuterol (VENTOLIN HFA) 108 (90 Base) MCG/ACT inhaler; Inhale 2 puffs into the lungs every 6 (six) hours as needed for wheezing or shortness of breath. -     For home use only DME Nebulizer machine -     albuterol (PROVENTIL) (2.5 MG/3ML) 0.083% nebulizer solution; Take 3 mLs (2.5 mg total) by nebulization every 6 (six) hours as needed for wheezing or shortness of breath.  Need for influenza vaccination -     Flu Vaccine QUAD 1moIM (Fluarix, Fluzone & Alfiuria Quad PF)  Need for pneumococcal vaccine -     Pneumococcal conjugate vaccine 20-valent (Prevnar 20)  Tobacco use -     varenicline (CHANTIX STARTING MONTH PAK) 0.5 MG X 11 & 1 MG X 42 tablet; Take one 0.5 mg tablet by mouth once a day x 3 days, increase to one 0.5 mg tab twice a day x 4 days, increase to one 1 mg tab twice a day  Chronic respiratory failure with hypoxia (HCC) -     For home use only DME Nebulizer machine  Lung nodule seen on imaging study -     CT CHEST NODULE FOLLOW UP LOW DOSE W/O; Future  Colon cancer screening -     Fecal occult blood, imunochemical([facility])  Prediabetes -     CMP14+EGFR -     Hemoglobin A1c  Leukocytosis, unspecified type -     CBC with Differential   Patient has been counseled on age-appropriate routine health concerns for screening and prevention. These are reviewed and up-to-date. Referrals have been placed accordingly. Immunizations are up-to-date or declined.    Subjective:   Chief Complaint  Patient presents with   Hospitalization Follow-up    HPI Evelyn Peterson 60y.o. female presents to office today for HFU. She has a past medical history of COPD () and Edema of lower extremity (03/2018).   Evelyn Peterson 8-1 through 10-30-2020 with COPD exacerbation and COPD. Initially required 15L NRB but was weaned down to home O2 3L upon discharge. She was also treated with Ceftriaxone, solumedrol, duonebs and azithromycin. CT showed LUL nodularity. She will require repeat CT in 3-6 months. She was discharged home in stable condition and instructed to resume inhalers. She does continue so smoke cigarettes. Trying to quit. Will resume chantix today.   Today she is requesting refill of her inhalers. Denies any chest pain, worsening cough or shortness of breath.   Prediabetes Well controlled at this time.  Lab Results  Component Value Date   HGBA1C 5.8 (H) 12/26/2018      Review of Systems  Constitutional:  Negative for fever, malaise/fatigue and weight loss.  HENT: Negative.  Negative for nosebleeds.   Eyes: Negative.  Negative for blurred vision, double vision and photophobia.  Respiratory: Negative.  Negative for cough and shortness of breath.   Cardiovascular: Negative.  Negative for chest pain, palpitations and leg swelling.  Gastrointestinal: Negative.  Negative for heartburn, nausea and vomiting.  Musculoskeletal: Negative.  Negative for myalgias.  Neurological: Negative.  Negative for dizziness, focal weakness, seizures  and headaches.  Psychiatric/Behavioral: Negative.  Negative for suicidal ideas.    Past Medical History:  Diagnosis Date   COPD (chronic obstructive pulmonary disease) ([place])    Edema of lower extremity 03/2018    Past Surgical History:  Procedure Laterality Date   TONSILLECTOMY      Family History  Problem Relation Age of Onset   Conductive hearing loss Mother    Diabetes Mother    Heart disease Mother        AMI/CHF   Hypertension Mother    Diabetes Father    Diabetes Sister    High blood  pressure Sister    High blood pressure Sister    Diabetes Sister     Social History Reviewed with no changes to be made today.   Outpatient Medications Prior to Visit  Medication Sig Dispense Refill   albuterol (VENTOLIN HFA) 108 (90 Base) MCG/ACT inhaler Inhale 2 puffs into the lungs every 6 (six) hours as needed for wheezing or shortness of breath. 18 g 1   umeclidinium bromide (INCRUSE ELLIPTA) 62.5 MCG/INH AEPB Inhale 1 puff into the lungs daily. 30 each 6   varenicline (CHANTIX STARTING MONTH PAK) 0.5 MG X 11 & 1 MG X 42 tablet Take one 0.5 mg tablet by mouth once a day x 3 days, increase to one 0.5 mg tab twice a day x 4 days, increase to one 1 mg tab twice a day 53 tablet 0   budesonide-formoterol (SYMBICORT) 160-4.5 MCG/ACT inhaler Inhale 2 puffs into the lungs 2 (two) times daily. 3 each 3   No facility-administered medications prior to visit.    No Known Allergies     Objective:    BP 139/86   Pulse 94   Ht 5' 8"  (1.727 m)   Wt 255 lb 8 oz (115.9 kg)   SpO2 94%   BMI 38.85 kg/m  Wt Readings from Last 3 Encounters:  01/07/21 255 lb 8 oz (115.9 kg)  10/28/20 255 lb (115.7 kg)  09/04/19 245 lb 6.4 oz (111.3 kg)    Physical Exam Vitals and nursing note reviewed.  Constitutional:      Appearance: She is well-developed.  HENT:     Head: Normocephalic and atraumatic.  Cardiovascular:     Rate and Rhythm: Normal rate and regular rhythm.     Heart sounds: Normal heart sounds. No murmur heard.   No friction rub. No gallop.  Pulmonary:     Effort: Pulmonary effort is normal. No tachypnea or respiratory distress.     Breath sounds: Normal breath sounds. No decreased breath sounds, wheezing, rhonchi or rales.  Chest:     Chest wall: No tenderness.  Abdominal:     General: Bowel sounds are normal.     Palpations: Abdomen is soft.  Musculoskeletal:        General: Normal range of motion.     Cervical back: Normal range of motion.  Skin:    General: Skin is warm and  dry.  Neurological:     Mental Status: She is alert and oriented to person, place, and time.     Coordination: Coordination normal.  Psychiatric:        Behavior: Behavior normal. Behavior is cooperative.        Thought Content: Thought content normal.        Judgment: Judgment normal.         Patient has been counseled extensively about nutrition and exercise as well as the importance of adherence  with medications and regular follow-up. The patient was given clear instructions to go to ER or return to medical center if symptoms don't improve, worsen or new problems develop. The patient verbalized understanding.   Follow-up: Return in about 6 months (around 07/08/2021).   [name], FNP-BC [facility] and [place], [place]   01/07/2021, 10:55 PM

## 2021-01-08 ENCOUNTER — Other Ambulatory Visit: Payer: Self-pay

## 2021-01-08 LAB — HEMOGLOBIN A1C
Est. average glucose Bld gHb Est-mCnc: 131 mg/dL
Hgb A1c MFr Bld: 6.2 % — ABNORMAL HIGH (ref 4.8–5.6)

## 2021-01-08 LAB — CMP14+EGFR
ALT: 9 IU/L (ref 0–32)
AST: 12 IU/L (ref 0–40)
Albumin/Globulin Ratio: 1.5 (ref 1.2–2.2)
Albumin: 4.2 g/dL (ref 3.8–4.8)
Alkaline Phosphatase: 86 IU/L (ref 44–121)
BUN/Creatinine Ratio: 21 (ref 12–28)
BUN: 13 mg/dL (ref 8–27)
Bilirubin Total: 0.3 mg/dL (ref 0.0–1.2)
CO2: 29 mmol/L (ref 20–29)
Calcium: 9.5 mg/dL (ref 8.7–10.3)
Chloride: 100 mmol/L (ref 96–106)
Creatinine, Ser: 0.62 mg/dL (ref 0.57–1.00)
Globulin, Total: 2.8 g/dL (ref 1.5–4.5)
Glucose: 103 mg/dL — ABNORMAL HIGH (ref 70–99)
Potassium: 5.1 mmol/L (ref 3.5–5.2)
Sodium: 145 mmol/L — ABNORMAL HIGH (ref 134–144)
Total Protein: 7 g/dL (ref 6.0–8.5)
eGFR: 101 mL/min/{1.73_m2} (ref >59)

## 2021-01-08 LAB — CBC WITH DIFFERENTIAL/PLATELET
Basophils Absolute: 0 10*3/uL (ref 0.0–0.2)
Basos: 0 %
EOS (ABSOLUTE): 0.2 10*3/uL (ref 0.0–0.4)
Eos: 2 %
Hematocrit: 47.6 % — ABNORMAL HIGH (ref 34.0–46.6)
Hemoglobin: 15.3 g/dL (ref 11.1–15.9)
Immature Grans (Abs): 0 10*3/uL (ref 0.0–0.1)
Immature Granulocytes: 0 %
Lymphocytes Absolute: 3 10*3/uL (ref 0.7–3.1)
Lymphs: 28 %
MCH: 31 pg (ref 26.6–33.0)
MCHC: 32.1 g/dL (ref 31.5–35.7)
MCV: 96 fL (ref 79–97)
Monocytes Absolute: 0.7 10*3/uL (ref 0.1–0.9)
Monocytes: 7 %
Neutrophils Absolute: 6.8 10*3/uL (ref 1.4–7.0)
Neutrophils: 63 %
Platelets: 332 10*3/uL (ref 150–450)
RBC: 4.94 x10E6/uL (ref 3.77–5.28)
RDW: 11.7 % (ref 11.7–15.4)
WBC: 10.8 10*3/uL (ref 3.4–10.8)

## 2021-01-08 NOTE — Progress Notes (Signed)
Called and informed pt about appt.

## 2021-01-14 ENCOUNTER — Other Ambulatory Visit: Payer: Self-pay | Admitting: Nurse Practitioner

## 2021-01-14 ENCOUNTER — Other Ambulatory Visit: Payer: Self-pay

## 2021-01-14 MED ORDER — ADVAIR HFA 230-21 MCG/ACT IN AERO
2.0000 | INHALATION_SPRAY | Freq: Two times a day (BID) | RESPIRATORY_TRACT | 12 refills | Status: DC
  Filled 2021-01-14: qty 12, 30d supply, fill #0

## 2021-01-14 MED ORDER — FLUTICASONE-SALMETEROL 250-50 MCG/ACT IN AEPB
1.0000 | INHALATION_SPRAY | Freq: Two times a day (BID) | RESPIRATORY_TRACT | 6 refills | Status: DC
  Filled 2021-01-14: qty 60, 30d supply, fill #0

## 2021-01-26 ENCOUNTER — Other Ambulatory Visit: Payer: Self-pay

## 2021-02-13 ENCOUNTER — Other Ambulatory Visit: Payer: Self-pay | Admitting: Nurse Practitioner

## 2021-02-13 NOTE — Progress Notes (Signed)
Called and informed scheduling of the prior auth number.

## 2021-02-13 NOTE — Progress Notes (Signed)
It has been approved. AUTH #[id number] CT chest lung nodule 11-18 Through 12-17

## 2021-02-16 ENCOUNTER — Ambulatory Visit (HOSPITAL_COMMUNITY): Payer: 59

## 2021-03-06 ENCOUNTER — Ambulatory Visit (HOSPITAL_COMMUNITY)
Admission: RE | Admit: 2021-03-06 | Discharge: 2021-03-06 | Disposition: A | Discharge status: Home / Self Care | Payer: 59 | Source: Ambulatory Visit | Attending: Nurse Practitioner | Admitting: Nurse Practitioner

## 2021-03-06 ENCOUNTER — Other Ambulatory Visit: Payer: Self-pay

## 2021-03-06 DIAGNOSIS — R911 Solitary pulmonary nodule: Secondary | ICD-10-CM | POA: Insufficient documentation

## 2021-05-12 ENCOUNTER — Other Ambulatory Visit: Payer: Self-pay

## 2021-05-12 ENCOUNTER — Encounter (HOSPITAL_COMMUNITY): Payer: Self-pay | Admitting: Emergency Medicine

## 2021-05-12 ENCOUNTER — Ambulatory Visit: Payer: Self-pay | Admitting: Nurse Practitioner

## 2021-05-12 ENCOUNTER — Emergency Department (HOSPITAL_COMMUNITY): Payer: Medicaid Other

## 2021-05-12 ENCOUNTER — Inpatient Hospital Stay (HOSPITAL_COMMUNITY)
Admission: EM | Admit: 2021-05-12 | Discharge: 2021-05-14 | DRG: 190 | Disposition: A | Discharge status: Home / Self Care | Payer: Medicaid Other | Attending: Internal Medicine | Admitting: Internal Medicine

## 2021-05-12 DIAGNOSIS — Z7951 Long term (current) use of inhaled steroids: Secondary | ICD-10-CM

## 2021-05-12 DIAGNOSIS — Z833 Family history of diabetes mellitus: Secondary | ICD-10-CM

## 2021-05-12 DIAGNOSIS — J449 Chronic obstructive pulmonary disease, unspecified: Secondary | ICD-10-CM

## 2021-05-12 DIAGNOSIS — J9622 Acute and chronic respiratory failure with hypercapnia: Secondary | ICD-10-CM | POA: Diagnosis present

## 2021-05-12 DIAGNOSIS — R7303 Prediabetes: Secondary | ICD-10-CM | POA: Diagnosis present

## 2021-05-12 DIAGNOSIS — Z20822 Contact with and (suspected) exposure to covid-19: Secondary | ICD-10-CM | POA: Diagnosis present

## 2021-05-12 DIAGNOSIS — J441 Chronic obstructive pulmonary disease with (acute) exacerbation: Principal | ICD-10-CM | POA: Diagnosis present

## 2021-05-12 DIAGNOSIS — I700 Atherosclerosis of aorta: Secondary | ICD-10-CM | POA: Diagnosis present

## 2021-05-12 DIAGNOSIS — F17210 Nicotine dependence, cigarettes, uncomplicated: Secondary | ICD-10-CM | POA: Diagnosis present

## 2021-05-12 DIAGNOSIS — Z79899 Other long term (current) drug therapy: Secondary | ICD-10-CM

## 2021-05-12 DIAGNOSIS — J9620 Acute and chronic respiratory failure, unspecified whether with hypoxia or hypercapnia: Secondary | ICD-10-CM | POA: Diagnosis present

## 2021-05-12 DIAGNOSIS — J9621 Acute and chronic respiratory failure with hypoxia: Secondary | ICD-10-CM | POA: Diagnosis present

## 2021-05-12 DIAGNOSIS — Z720 Tobacco use: Secondary | ICD-10-CM | POA: Diagnosis present

## 2021-05-12 DIAGNOSIS — Z8701 Personal history of pneumonia (recurrent): Secondary | ICD-10-CM

## 2021-05-12 DIAGNOSIS — E8729 Other acidosis: Secondary | ICD-10-CM | POA: Diagnosis present

## 2021-05-12 LAB — I-STAT VENOUS BLOOD GAS, ED
Acid-Base Excess: 10 mmol/L — ABNORMAL HIGH (ref 0.0–2.0)
Bicarbonate: 39.4 mmol/L — ABNORMAL HIGH (ref 20.0–28.0)
Calcium, Ion: 1.14 mmol/L — ABNORMAL LOW (ref 1.15–1.40)
HCT: 47 % — ABNORMAL HIGH (ref 36.0–46.0)
Hemoglobin: 16 g/dL — ABNORMAL HIGH (ref 12.0–15.0)
O2 Saturation: 97 %
Potassium: 4.4 mmol/L (ref 3.5–5.1)
Sodium: 138 mmol/L (ref 135–145)
TCO2: 42 mmol/L — ABNORMAL HIGH (ref 22–32)
pCO2, Ven: 70.5 mmHg (ref 44–60)
pH, Ven: 7.355 (ref 7.25–7.43)
pO2, Ven: 94 mmHg — ABNORMAL HIGH (ref 32–45)

## 2021-05-12 LAB — CBC WITH DIFFERENTIAL/PLATELET
Abs Immature Granulocytes: 0.04 10*3/uL (ref 0.00–0.07)
Basophils Absolute: 0.1 10*3/uL (ref 0.0–0.1)
Basophils Relative: 0 %
Eosinophils Absolute: 0.5 10*3/uL (ref 0.0–0.5)
Eosinophils Relative: 4 %
HCT: 48.2 % — ABNORMAL HIGH (ref 36.0–46.0)
Hemoglobin: 14.9 g/dL (ref 12.0–15.0)
Immature Granulocytes: 0 %
Lymphocytes Relative: 35 %
Lymphs Abs: 4 10*3/uL (ref 0.7–4.0)
MCH: 32.2 pg (ref 26.0–34.0)
MCHC: 30.9 g/dL (ref 30.0–36.0)
MCV: 104.1 fL — ABNORMAL HIGH (ref 80.0–100.0)
Monocytes Absolute: 0.7 10*3/uL (ref 0.1–1.0)
Monocytes Relative: 6 %
Neutro Abs: 6.2 10*3/uL (ref 1.7–7.7)
Neutrophils Relative %: 55 %
Platelets: 259 10*3/uL (ref 150–400)
RBC: 4.63 MIL/uL (ref 3.87–5.11)
RDW: 12.5 % (ref 11.5–15.5)
WBC: 11.5 10*3/uL — ABNORMAL HIGH (ref 4.0–10.5)
nRBC: 0 % (ref 0.0–0.2)

## 2021-05-12 LAB — BASIC METABOLIC PANEL
Anion gap: 9 (ref 5–15)
BUN: 9 mg/dL (ref 8–23)
CO2: 33 mmol/L — ABNORMAL HIGH (ref 22–32)
Calcium: 9.2 mg/dL (ref 8.9–10.3)
Chloride: 96 mmol/L — ABNORMAL LOW (ref 98–111)
Creatinine, Ser: 0.57 mg/dL (ref 0.44–1.00)
GFR, Estimated: >60 mL/min (ref >60)
Glucose, Bld: 162 mg/dL — ABNORMAL HIGH (ref 70–99)
Potassium: 4 mmol/L (ref 3.5–5.1)
Sodium: 138 mmol/L (ref 135–145)

## 2021-05-12 LAB — RESP PANEL BY RT-PCR (FLU A&B, COVID) ARPGX2
Influenza A by PCR: NEGATIVE
Influenza B by PCR: NEGATIVE
SARS Coronavirus 2 by RT PCR: NEGATIVE

## 2021-05-12 LAB — HEMOGLOBIN A1C
Hgb A1c MFr Bld: 5.5 % (ref 4.8–5.6)
Mean Plasma Glucose: 111.15 mg/dL

## 2021-05-12 LAB — HIV ANTIBODY (ROUTINE TESTING W REFLEX): HIV Screen 4th Generation wRfx: NONREACTIVE

## 2021-05-12 LAB — TROPONIN I (HIGH SENSITIVITY)
Troponin I (High Sensitivity): 11 ng/L (ref <18)
Troponin I (High Sensitivity): 11 ng/L (ref <18)

## 2021-05-12 LAB — LACTIC ACID, PLASMA: Lactic Acid, Venous: 1 mmol/L (ref 0.5–1.9)

## 2021-05-12 LAB — CBG MONITORING, ED
Glucose-Capillary: 151 mg/dL — ABNORMAL HIGH (ref 70–99)
Glucose-Capillary: 168 mg/dL — ABNORMAL HIGH (ref 70–99)

## 2021-05-12 LAB — BRAIN NATRIURETIC PEPTIDE: B Natriuretic Peptide: 18 pg/mL (ref 0.0–100.0)

## 2021-05-12 MED ORDER — SODIUM CHLORIDE 0.9 % IV SOLN
500.0000 mg | INTRAVENOUS | Status: DC
  Administered 2021-05-12 – 2021-05-13 (×2): 500 mg via INTRAVENOUS
  Filled 2021-05-12 (×3): qty 5

## 2021-05-12 MED ORDER — PREDNISONE 20 MG PO TABS
40.0000 mg | ORAL_TABLET | Freq: Every day | ORAL | Status: DC
  Administered 2021-05-13 – 2021-05-14 (×2): 40 mg via ORAL
  Filled 2021-05-12 (×2): qty 2

## 2021-05-12 MED ORDER — BUDESONIDE 0.5 MG/2ML IN SUSP
2.0000 mg | Freq: Two times a day (BID) | RESPIRATORY_TRACT | Status: DC
  Administered 2021-05-12 (×2): 2 mg via RESPIRATORY_TRACT
  Administered 2021-05-13 (×2): 0.5 mg via RESPIRATORY_TRACT
  Administered 2021-05-14: 2 mg via RESPIRATORY_TRACT
  Filled 2021-05-12 (×5): qty 8

## 2021-05-12 MED ORDER — METHYLPREDNISOLONE SODIUM SUCC 40 MG IJ SOLR
40.0000 mg | Freq: Three times a day (TID) | INTRAMUSCULAR | Status: AC
  Administered 2021-05-12 – 2021-05-13 (×3): 40 mg via INTRAVENOUS
  Filled 2021-05-12 (×3): qty 1

## 2021-05-12 MED ORDER — IPRATROPIUM-ALBUTEROL 0.5-2.5 (3) MG/3ML IN SOLN
3.0000 mL | RESPIRATORY_TRACT | Status: DC
  Administered 2021-05-12 – 2021-05-13 (×5): 3 mL via RESPIRATORY_TRACT
  Filled 2021-05-12 (×7): qty 3

## 2021-05-12 MED ORDER — INSULIN ASPART 100 UNIT/ML IJ SOLN
0.0000 [IU] | Freq: Three times a day (TID) | INTRAMUSCULAR | Status: DC
  Administered 2021-05-12 – 2021-05-13 (×3): 2 [IU] via SUBCUTANEOUS
  Administered 2021-05-13: 1 [IU] via SUBCUTANEOUS
  Administered 2021-05-13: 2 [IU] via SUBCUTANEOUS
  Administered 2021-05-14 (×2): 1 [IU] via SUBCUTANEOUS

## 2021-05-12 MED ORDER — ENOXAPARIN SODIUM 40 MG/0.4ML IJ SOSY
40.0000 mg | PREFILLED_SYRINGE | INTRAMUSCULAR | Status: DC
  Administered 2021-05-12 – 2021-05-13 (×2): 40 mg via SUBCUTANEOUS
  Filled 2021-05-12 (×3): qty 0.4

## 2021-05-12 NOTE — H&P (Addendum)
NAME:  Evelyn Peterson, MRN:  [id number], DOB:  07-26-1960, LOS: 0 ADMISSION DATE:  05/12/2021, Primary: Evelyn Pounds, NP  CHIEF COMPLAINT:  shortness of breath   Medical Service: Internal Medicine Teaching Service         Attending Physician: Dr. Lottie Mussel, MD    First Contact: Dr. Jeanice Lim Pager: [phone]  Second Contact: Dr. Johnney Ou Pager: [phone]       After Hours (After 5p/  First Contact Pager: [phone]  weekends / holidays): Second Contact Pager: [phone]    History of present illness   Evelyn Peterson is a 61 year old female with chronic respiratory failure (on 2L) secondary to COPD and tobacco use who presented to the ED via EMS for 2w history of progressive shortness of breath and productive cough with increased sputum production, despite increased use of albuterol nebs (up to 10x per day). Acute worsening of symptoms over the past 2 days prompted her to call EMS.    EDP and EMS report notes that oxygen saturations reportedly 76% on chronic 2L upon arrival of the fire department, improved to 95% on 15L NRB by time of EMS arrival. She received albuterol and atrovent nebs and 125mg  solumedrol by EMS en route to [facility].  She denies fevers, chills, upper respiratory symptoms, chest pain, nausea, vomiting, diarrhea, or abdominal pain.  She reports that her grandchildren, whom she sees often, have been sick with respiratory illness recently.  She reports a long standing smoking history, most recently 1/2 pack per day. She has not smoked over the past week due to difficulty breathing.   Baseline Functional Assessment: She notes that, after her admission last August for pneumonia/COPDE, she never fully recovered and has required supplemental oxygen use. She has slept upright in a chair for about 2 years due to shortness of breath with laying down. She reports a chronic productive cough that is worse in the evenings. She is able to perform ADLs and iADLs without difficulty on  2L of oxygen.   Past Medical History  She,  has a past medical history of COPD (chronic obstructive pulmonary disease) (HCC) and Edema of lower extremity (03/2018).   Home Medications     Prior to Admission medications   Medication Sig Start Date End Date Taking? Authorizing Provider  albuterol (PROVENTIL) (2.5 MG/3ML) 0.083% nebulizer solution Take 3 mLs (2.5 mg total) by nebulization every 6 (six) hours as needed for wheezing or shortness of breath. 01/07/21   Evelyn Pounds, NP  albuterol (VENTOLIN HFA) 108 (90 Base) MCG/ACT inhaler Inhale 2 puffs into the lungs every 6 (six) hours as needed for wheezing or shortness of breath. 01/07/21   Evelyn Pounds, NP  fluticasone-salmeterol (ADVAIR DISKUS) 250-50 MCG/ACT AEPB Inhale 1 puff into the lungs in the morning and at bedtime. 01/14/21   Evelyn Pounds, NP  varenicline (CHANTIX STARTING MONTH PAK) 0.5 MG X 11 & 1 MG X 42 tablet Take one 0.5 mg tablet by mouth once a day x 3 days, increase to one 0.5 mg tab twice a day x 4 days, increase to one 1 mg tab twice a day 01/07/21   Evelyn Pounds, NP    Allergies    Allergies as of 05/12/2021   (No Known Allergies)    Social History   reports that she has been smoking cigarettes. She has a 40.00 pack-year smoking history. She has never used smokeless tobacco. She reports current alcohol use. She reports that  she does not use drugs.   Family History   Her family history includes Conductive hearing loss in her mother; Diabetes in her father, mother, sister, and sister; Heart disease in her mother; High blood pressure in her sister and sister; Hypertension in her mother.   ROS  10 point review of systems negative unless stated in the HPI.  Objective   Blood pressure (!) 100/53, pulse 75, temperature 98.3 F (36.8 C), temperature source Oral, resp. rate 20, SpO2 94 %.    General: ill appearing female in no distress HEENT: MMM, head atraumatic Eyes: no scleral icterus or conjunctival  injection Cardiac: RRR, no LE edema, extremities warm Pulm: breathing comfortably on 5L supplemental oxygen via [place]. Able to speak in full sentences. No accessory muscle use. Diffuse expiratory wheezing with prolonged expiratory phase. Cough productive of yellowish-white mucous.  GI: abd soft, non-tender, non-distended Skin: no rash or lesion on limited exam MSK: normal muscle bulk and tone Neuro: a/o x4. No focal deficit appreciated on limited exam.  Significant Diagnostic Tests:   CBC Latest Ref Rng & Units 05/12/2021 01/07/2021 10/30/2020  WBC 4.0 - 10.5 K/uL 11.5(H) 10.8 10.7(H)  Hemoglobin 12.0 - 15.0 g/dL 14.9 15.3 14.7  Hematocrit 36.0 - 46.0 % 48.2(H) 47.6(H) 47.5(H)  Platelets 150 - 400 K/uL 259 332 278   BMP Latest Ref Rng & Units 05/12/2021 01/07/2021 10/29/2020  Glucose 70 - 99 mg/dL 162(H) 103(H) 161(H)  BUN 8 - 23 mg/dL 9 13 13   Creatinine 0.44 - 1.00 mg/dL 0.57 0.62 0.63  BUN/Creat Ratio 12 - 28 - 21 -  Sodium 135 - 145 mmol/L 138 145(H) 140  Potassium 3.5 - 5.1 mmol/L 4.0 5.1 4.8  Chloride 98 - 111 mmol/L 96(L) 100 96(L)  CO2 22 - 32 mmol/L 33(H) 29 38(H)  Calcium 8.9 - 10.3 mg/dL 9.2 9.5 8.8(L)   CXR: no infiltrate or effusion EKG: SR; RBBB  Summary  6 yof with chronic respiratory failure secondary to COPD who presented to the ED for shortness of breath and was found to have a COPD exacerbation with acute on chronic hypoxic respiratory failure.   Assessment & Plan:  Principal Problem:   COPD exacerbation ([place])  Acute on chronic hypoxic and hypercapnic respiratory failure secondary to COPD exacerbation, emphysematous type On 2L supplemental oxygen at baseline. Requiring 5L to maintain O2 sats >88% in ED. VBG pCO2 70 however elevated bicarb to 40 and normal pH suggests chronic process.  Received 125mg  IV solumedrol by EMS. COVID, Influenza negative. Afebrile, no focal infiltrate on CXR. Mild leukocytosis 11.5 likely de-margination. No left shift. No tachycardia, low  suspicion for PE. Troponins flat.  Sepsis ruled out--does not meet criteria Solumedrol 40mg  q8h for today, can transition or oral prednisone if improving tomorrow Azithromycin, Pulmicort nebs, scheduled duonebs Maintain O2 sats 88-92% Recommend follow up with pulmonology 4-6 weeks after discharge with PFTs  Prediabetes Check A1C Will need SSI with high dose steroids  Tobacco use disorder. Encourage cessation. Nicotine patch declined  Aortic Atherosclerosis. Follow up with PCP after discharge to discuss statin initiation with lipid panel. .  Best practice:  CODE STATUS: Full DVT for prophylaxis: lovenox Dispo: Admit patient to Observation with expected length of stay less than 2 midnights.   Mitzi Hansen, MD Internal Medicine Resident PGY-3 [facility] Pager: [phone] 05/12/2021 11:21 AM

## 2021-05-12 NOTE — ED Provider Notes (Signed)
7:05 AM Care assumed from Dr. Blinda Leatherwood.  At time of transfer of care, patient is awaiting BNP to return as well as reassessment after her continuous neb.  Patient thought she may have pneumonia however work-up did not show evidence of consolidation on chest x-ray.  Previous team were suspicious for COPD exacerbation.  She is already received Solu-Medrol and is getting continuous neb at this time.  Plan of care be to ambulate with pulse ox to see if she gets hypoxic as she reportedly was at home with EMS on her home 2 L.  She is currently on 4 L so we will need to de-escalate her.  If patient continues to have increased oxygen requirement or is symptomatic and feeling worse, patient may end up requiring admission to the hospital per previous team.  Anticipate reassessment after treatments.  8:36 AM BNP returned normal.  Suspect COPD exacerbation in the setting of likely viral infection.  There was no pneumonia seen on x-ray and I discussed with patient.  I went to reassess patient and her oxygen was still at 90% on the 4 L.  She is still feeling short of breath and still having some wheezing.  Oxygen was increased to 5 with improvement into the mid 90s.  As she is normally on 2, this is worse and she still feels that she is not breathing well and does not feel safe going home.  We will call for admission for COPD exacerbation with increased oxygen requirement.   Mirabelle Cyphers, Canary Brim, MD 05/12/21 1057

## 2021-05-12 NOTE — Evaluation (Signed)
Physical Therapy Evaluation Patient Details Name: Evelyn Peterson MRN: [id number] DOB: 1961-01-06 Today's Date: 05/12/2021  History of Present Illness  Patient is a 61 y/o female presenting on 05/12/21 following a COPD exacerbation. PMH includes respiratory failure, COPD, on 2L O2, LE edema, and arthrosclerosis.  Clinical Impression  Patient presents with the problem above and the impairments below. Patient required supervision to min guard assist for safety during functional mobility. Patient required 5L O2 via nasal cannula during ambulation that kept her SpO2 at and greater than 90%. RN notified. PT recommending a rollator for use at home for longer distances. Patient would likely benefit from a follow up with pulmonary rehab to improve endurance. PT will follow acutely.        Recommendations for follow up therapy are one component of a multi-disciplinary discharge planning process, led by the attending physician.  Recommendations may be updated based on patient status, additional functional criteria and insurance authorization.  Follow Up Recommendations No PT follow up    Assistance Recommended at Discharge Intermittent Supervision/Assistance  Patient can return home with the following  A little help with walking and/or transfers;Assistance with cooking/housework;Assist for transportation;Help with stairs or ramp for entrance    Equipment Recommendations Rollator (4 wheels) (patient likely will decline recommendation)  Recommendations for Other Services  Other (comment) (Pt would benefit from pulmonary rehab)    Functional Status Assessment Patient has had a recent decline in their functional status and demonstrates the ability to make significant improvements in function in a reasonable and predictable amount of time.     Precautions / Restrictions Precautions Precautions: Other (comment) Precaution Comments: watch O2 Restrictions Weight Bearing Restrictions: No       Mobility  Bed Mobility Overal bed mobility: Needs Assistance Bed Mobility: Supine to Sit, Sit to Supine     Supine to sit: Modified independent (Device/Increase time) (increased time) Sit to supine: Supervision (for safety)   General bed mobility comments: patient needed to sit up on EOB while PT documenting PLOF due to productive coughing spell.    Transfers Overall transfer level: Needs assistance Equipment used: None Transfers: Sit to/from Stand Sit to Stand: Min guard (for safety)           General transfer comment: mild sway upon standing    Ambulation/Gait Ambulation/Gait assistance: Min guard Gait Distance (Feet): 150 Feet Assistive device: None Gait Pattern/deviations: Decreased step length - left, Decreased step length - right, Decreased stride length, Shuffle, Narrow base of support Gait velocity: decreased Gait velocity interpretation: 1.31 - 2.62 ft/sec, indicative of limited community ambulator Pre-gait activities: marching General Gait Details: Patient was on 5L O2 w/ nasal cannula during ambulation. Upon return to room, O2 sats at 90%. Patient swayed during ambulation and required min guard assist with walking.  Stairs            Wheelchair Mobility    Modified Rankin (Stroke Patients Only)       Balance Overall balance assessment: Needs assistance Sitting-balance support: Feet unsupported, Single extremity supported Sitting balance-Leahy Scale: Good Sitting balance - Comments: able to sit EOB with supervision for safety   Standing balance support: No upper extremity supported, During functional activity Standing balance-Leahy Scale: Fair Standing balance comment: sway noted upon standing.                 Standardized Balance Assessment Standardized Balance Assessment : Dynamic Gait Index   Dynamic Gait Index Level Surface: Mild Impairment Change in Gait Speed: Mild Impairment  Gait with Horizontal Head Turns: Moderate  Impairment Gait with Vertical Head Turns: Moderate Impairment Gait and Pivot Turn: Moderate Impairment Step Over Obstacle: Mild Impairment       Pertinent Vitals/Pain Pain Assessment Pain Assessment: Faces Faces Pain Scale: No hurt    Home Living Family/patient expects to be discharged to:: Private residence Living Arrangements: Other relatives Available Help at Discharge: Family;Available 24 hours/day Type of Home: House Home Access: Stairs to enter Entrance Stairs-Rails: None Entrance Stairs-Number of Steps: 2 Alternate Level Stairs-Number of Steps: 10 (has chair lift) Home Layout: Two level Home Equipment: Grab bars - tub/shower Additional Comments: have a chair lift on stairs    Prior Function Prior Level of Function : Independent/Modified Independent             Mobility Comments: on 2L O2 via nasal cannula around house. Not a community ambulator. Stays in house on normal day to day ADLs Comments: independent     Hand Dominance   Dominant Hand: Right    Extremity/Trunk Assessment   Upper Extremity Assessment Upper Extremity Assessment: Overall WFL for tasks assessed    Lower Extremity Assessment Lower Extremity Assessment: Generalized weakness    Cervical / Trunk Assessment Cervical / Trunk Assessment: Kyphotic  Communication   Communication: No difficulties  Cognition Arousal/Alertness: Awake/alert Behavior During Therapy: Anxious Overall Cognitive Status: Within Functional Limits for tasks assessed                                 General Comments: patient constantly bounced leg up and down in sitting during home questions. Mildly anxious throughout        General Comments General comments (skin integrity, edema, etc.): Mild labored breathing after walking 80 feet in hallway. SpO2 was 90% with activity on 5L O2. Patient educated on using her pulse oximeter at home to make sure her O2 remains above her normal. Pt educated on returning  to the ED if she desats severely below normal.    Exercises     Assessment/Plan    PT Assessment Patient needs continued PT services  PT Problem List Decreased activity tolerance;Decreased balance;Decreased knowledge of use of DME;Decreased safety awareness       PT Treatment Interventions DME instruction;Gait training;Stair training;Functional mobility training;Therapeutic activities;Therapeutic exercise;Balance training;Patient/family education    PT Goals (Current goals can be found in the Care Plan section)  Acute Rehab PT Goals Patient Stated Goal: to go home PT Goal Formulation: With patient Time For Goal Achievement: 05/26/21 Potential to Achieve Goals: Good    Frequency Min 3X/week     Co-evaluation               AM-PAC PT "6 Clicks" Mobility  Outcome Measure Help needed turning from your back to your side while in a flat bed without using bedrails?: None Help needed moving from lying on your back to sitting on the side of a flat bed without using bedrails?: A Little Help needed moving to and from a bed to a chair (including a wheelchair)?: A Little Help needed standing up from a chair using your arms (e.g., wheelchair or bedside chair)?: A Little Help needed to walk in hospital room?: A Little Help needed climbing 3-5 steps with a railing? : A Lot 6 Click Score: 18    End of Session Equipment Utilized During Treatment: Gait belt;Oxygen (5L O2 w/ nasal cannula) Activity Tolerance: Patient tolerated treatment well Patient left: in  bed;with call bell/phone within reach (on stretcher in ED) Nurse Communication: Mobility status;Other (comment) (O2 with ambulating) PT Visit Diagnosis: Unsteadiness on feet (R26.81);Other abnormalities of gait and mobility (R26.89)    Time: 1497-0263 PT Time Calculation (min) (ACUTE ONLY): 19 min   Charges:   PT Evaluation $PT Eval Low Complexity: 1 Low          Enis Slipper, SPT  [place] 05/12/2021, 4:13 PM

## 2021-05-12 NOTE — ED Triage Notes (Signed)
Patient from home with sudden onset of shortness of breath in severity, but she has been having shortness of breath for a few weeks. Patient was found with oxygen sat of 75% on room air.  No chest pain, but does have some nausea.  Patient was given one duoneb (5mg  albuterol and 0.5mg  Atrovent)and 125mg  solumedrol.

## 2021-05-12 NOTE — ED Provider Notes (Addendum)
[facility] EMERGENCY DEPARTMENT Provider Note   CSN: [id number] Arrival date & time: 05/12/21  0550     History  Chief Complaint  Patient presents with   Shortness of Breath    Evelyn Peterson is a 61 y.o. female.  Patient presents to the emergency department for evaluation of shortness of breath.  Patient reports that she has a history of COPD, has been having increased difficulty breathing for the last couple of weeks but overnight it significantly worsened.  She reports a new, worsening productive cough.  She reports that this feels like when she has had pneumonia in the past.  Patient is normally on 2 L by nasal cannula at home.  Patient was found to be hypoxic, oxygen saturations of 75% on her normal oxygen.  She arrives in the department on a nonrebreather.      Home Medications Prior to Admission medications   Medication Sig Start Date End Date Taking? Authorizing Provider  albuterol (PROVENTIL) (2.5 MG/3ML) 0.083% nebulizer solution Take 3 mLs (2.5 mg total) by nebulization every 6 (six) hours as needed for wheezing or shortness of breath. 01/07/21   Claiborne Rigg, NP  albuterol (VENTOLIN HFA) 108 (90 Base) MCG/ACT inhaler Inhale 2 puffs into the lungs every 6 (six) hours as needed for wheezing or shortness of breath. 01/07/21   Claiborne Rigg, NP  fluticasone-salmeterol (ADVAIR DISKUS) 250-50 MCG/ACT AEPB Inhale 1 puff into the lungs in the morning and at bedtime. 01/14/21   Claiborne Rigg, NP  varenicline (CHANTIX STARTING MONTH PAK) 0.5 MG X 11 & 1 MG X 42 tablet Take one 0.5 mg tablet by mouth once a day x 3 days, increase to one 0.5 mg tab twice a day x 4 days, increase to one 1 mg tab twice a day 01/07/21   Claiborne Rigg, NP      Allergies    Patient has no known allergies.    Review of Systems   Review of Systems  Respiratory:  Positive for cough, shortness of breath and wheezing.    Physical Exam Updated Vital Signs BP 135/79 (BP  Location: Right Arm)    Pulse 85    Temp 98.3 F (36.8 C) (Oral)    Resp 20    SpO2 97%  Physical Exam Vitals and nursing note reviewed.  Constitutional:      General: She is not in acute distress.    Appearance: She is well-developed.  HENT:     Head: Normocephalic and atraumatic.     Mouth/Throat:     Mouth: Mucous membranes are moist.  Eyes:     General: Vision grossly intact. Gaze aligned appropriately.     Extraocular Movements: Extraocular movements intact.     Conjunctiva/sclera: Conjunctivae normal.  Cardiovascular:     Rate and Rhythm: Normal rate and regular rhythm.     Pulses: Normal pulses.     Heart sounds: Normal heart sounds, S1 normal and S2 normal. No murmur heard.   No friction rub. No gallop.  Pulmonary:     Effort: Tachypnea and accessory muscle usage present. No respiratory distress.     Breath sounds: Decreased breath sounds and wheezing present.  Abdominal:     General: Bowel sounds are normal.     Palpations: Abdomen is soft.     Tenderness: There is no abdominal tenderness. There is no guarding or rebound.     Hernia: No hernia is present.  Musculoskeletal:  General: No swelling.     Cervical back: Full passive range of motion without pain, normal range of motion and neck supple. No spinous process tenderness or muscular tenderness. Normal range of motion.     Right lower leg: No edema.     Left lower leg: No edema.  Skin:    General: Skin is warm and dry.     Capillary Refill: Capillary refill takes less than 2 seconds.     Findings: No ecchymosis, erythema, rash or wound.  Neurological:     General: No focal deficit present.     Mental Status: She is alert and oriented to person, place, and time.     GCS: GCS eye subscore is 4. GCS verbal subscore is 5. GCS motor subscore is 6.     Cranial Nerves: Cranial nerves 2-12 are intact.     Sensory: Sensation is intact.     Motor: Motor function is intact.     Coordination: Coordination is intact.   Psychiatric:        Attention and Perception: Attention normal.        Mood and Affect: Mood normal.        Speech: Speech normal.        Behavior: Behavior normal.    ED Results / Procedures / Treatments   Labs (all labs ordered are listed, but only abnormal results are displayed) Labs Reviewed  CBC WITH DIFFERENTIAL/PLATELET - Abnormal; Notable for the following components:      Result Value   WBC 11.5 (*)    HCT 48.2 (*)    MCV 104.1 (*)    All other components within normal limits  BASIC METABOLIC PANEL - Abnormal; Notable for the following components:   Chloride 96 (*)    CO2 33 (*)    Glucose, Bld 162 (*)    All other components within normal limits  RESP PANEL BY RT-PCR (FLU A&B, COVID) ARPGX2  CULTURE, BLOOD (SINGLE)  LACTIC ACID, PLASMA  BRAIN NATRIURETIC PEPTIDE  TROPONIN I (HIGH SENSITIVITY)    EKG EKG Interpretation  Date/Time:  Tuesday May 12 2021 07:27:42 EST Ventricular Rate:  85 PR Interval:  171 QRS Duration: 154 QT Interval:  421 QTC Calculation: 501 R Axis:   92 Text Interpretation: Sinus rhythm Right bundle branch block No significant change since last tracing Confirmed by Gilda Crease [phone]) on 05/12/2021 7:31:24 AM  Radiology DG Chest Port 1 View  Result Date: 05/12/2021 CLINICAL DATA:  Shortness of breath. EXAM: PORTABLE CHEST 1 VIEW COMPARISON:  10/27/2020 FINDINGS: The heart size and mediastinal contours are within normal limits. Both lungs are clear. The visualized skeletal structures are unremarkable. IMPRESSION: No active disease. Electronically Signed   By: Signa Kell M.D.   On: 05/12/2021 06:47    Procedures Procedures    Medications Ordered in ED Medications - No data to display  ED Course/ Medical Decision Making/ A&P                           Medical Decision Making Amount and/or Complexity of Data Reviewed Labs: ordered. Decision-making details documented in ED Course. Radiology: ordered and independent  interpretation performed. Decision-making details documented in ED Course. ECG/medicine tests: ordered and independent interpretation performed. Decision-making details documented in ED Course.   Patient presents emerged part with shortness of breath.  She does have a history of COPD.  Patient reports that it feels like previous pneumonia.  She was  hypoxic initially.  Chest x-ray does not show pneumonia or signs of heart failure.  Lab work is unremarkable other than very slight elevation of white blood cell count.  Patient was given IV Solu-Medrol by EMS.  She was also given a DuoNeb.  Patient will be given a continuous nebulizer treatment and reevaluated.  She will need to ambulate and see if she is still hypoxic.  If hypoxic will require hospitalization.  We will sign out to oncoming ER physician to follow-up.        Final Clinical Impression(s) / ED Diagnoses Final diagnoses:  COPD exacerbation [facility])    Rx / DC Orders ED Discharge Orders     None         Analeia Ismael, Canary Brim, MD 05/12/21 3212    Gilda Crease, MD 05/12/21 [phone]

## 2021-05-13 ENCOUNTER — Other Ambulatory Visit (HOSPITAL_COMMUNITY): Payer: Self-pay

## 2021-05-13 DIAGNOSIS — Z8701 Personal history of pneumonia (recurrent): Secondary | ICD-10-CM | POA: Diagnosis not present

## 2021-05-13 DIAGNOSIS — R7303 Prediabetes: Secondary | ICD-10-CM | POA: Diagnosis present

## 2021-05-13 DIAGNOSIS — J441 Chronic obstructive pulmonary disease with (acute) exacerbation: Secondary | ICD-10-CM | POA: Diagnosis present

## 2021-05-13 DIAGNOSIS — J9622 Acute and chronic respiratory failure with hypercapnia: Secondary | ICD-10-CM | POA: Diagnosis present

## 2021-05-13 DIAGNOSIS — E8729 Other acidosis: Secondary | ICD-10-CM | POA: Diagnosis present

## 2021-05-13 DIAGNOSIS — Z833 Family history of diabetes mellitus: Secondary | ICD-10-CM | POA: Diagnosis not present

## 2021-05-13 DIAGNOSIS — Z79899 Other long term (current) drug therapy: Secondary | ICD-10-CM | POA: Diagnosis not present

## 2021-05-13 DIAGNOSIS — R0602 Shortness of breath: Secondary | ICD-10-CM | POA: Diagnosis present

## 2021-05-13 DIAGNOSIS — J9621 Acute and chronic respiratory failure with hypoxia: Secondary | ICD-10-CM | POA: Diagnosis present

## 2021-05-13 DIAGNOSIS — I700 Atherosclerosis of aorta: Secondary | ICD-10-CM | POA: Diagnosis present

## 2021-05-13 DIAGNOSIS — F17210 Nicotine dependence, cigarettes, uncomplicated: Secondary | ICD-10-CM | POA: Diagnosis present

## 2021-05-13 DIAGNOSIS — Z7951 Long term (current) use of inhaled steroids: Secondary | ICD-10-CM | POA: Diagnosis not present

## 2021-05-13 DIAGNOSIS — Z720 Tobacco use: Secondary | ICD-10-CM

## 2021-05-13 DIAGNOSIS — Z20822 Contact with and (suspected) exposure to covid-19: Secondary | ICD-10-CM | POA: Diagnosis present

## 2021-05-13 LAB — CBC WITH DIFFERENTIAL/PLATELET
Abs Immature Granulocytes: 0.05 10*3/uL (ref 0.00–0.07)
Basophils Absolute: 0 10*3/uL (ref 0.0–0.1)
Basophils Relative: 0 %
Eosinophils Absolute: 0 10*3/uL (ref 0.0–0.5)
Eosinophils Relative: 0 %
HCT: 46.3 % — ABNORMAL HIGH (ref 36.0–46.0)
Hemoglobin: 14.3 g/dL (ref 12.0–15.0)
Immature Granulocytes: 0 %
Lymphocytes Relative: 12 %
Lymphs Abs: 1.5 10*3/uL (ref 0.7–4.0)
MCH: 31.4 pg (ref 26.0–34.0)
MCHC: 30.9 g/dL (ref 30.0–36.0)
MCV: 101.5 fL — ABNORMAL HIGH (ref 80.0–100.0)
Monocytes Absolute: 0.5 10*3/uL (ref 0.1–1.0)
Monocytes Relative: 4 %
Neutro Abs: 10.6 10*3/uL — ABNORMAL HIGH (ref 1.7–7.7)
Neutrophils Relative %: 84 %
Platelets: 257 10*3/uL (ref 150–400)
RBC: 4.56 MIL/uL (ref 3.87–5.11)
RDW: 12.4 % (ref 11.5–15.5)
WBC: 12.7 10*3/uL — ABNORMAL HIGH (ref 4.0–10.5)
nRBC: 0 % (ref 0.0–0.2)

## 2021-05-13 LAB — BLOOD GAS, VENOUS
Acid-Base Excess: 13.2 mmol/L — ABNORMAL HIGH (ref 0.0–2.0)
Bicarbonate: 42.5 mmol/L — ABNORMAL HIGH (ref 20.0–28.0)
Drawn by: 1390
O2 Saturation: 38.4 %
Patient temperature: 36.7
pCO2, Ven: 76 mmHg (ref 44–60)
pH, Ven: 7.35 (ref 7.25–7.43)
pO2, Ven: <31 mmHg — CL (ref 32–45)

## 2021-05-13 LAB — FOLATE: Folate: 16 ng/mL (ref >5.9)

## 2021-05-13 LAB — BASIC METABOLIC PANEL
Anion gap: 7 (ref 5–15)
BUN: 14 mg/dL (ref 8–23)
CO2: 36 mmol/L — ABNORMAL HIGH (ref 22–32)
Calcium: 9.2 mg/dL (ref 8.9–10.3)
Chloride: 98 mmol/L (ref 98–111)
Creatinine, Ser: 0.69 mg/dL (ref 0.44–1.00)
GFR, Estimated: >60 mL/min (ref >60)
Glucose, Bld: 178 mg/dL — ABNORMAL HIGH (ref 70–99)
Potassium: 4.2 mmol/L (ref 3.5–5.1)
Sodium: 141 mmol/L (ref 135–145)

## 2021-05-13 LAB — VITAMIN B12: Vitamin B-12: 385 pg/mL (ref 180–914)

## 2021-05-13 LAB — GLUCOSE, CAPILLARY
Glucose-Capillary: 198 mg/dL — ABNORMAL HIGH (ref 70–99)
Glucose-Capillary: 121 mg/dL — ABNORMAL HIGH (ref 70–99)
Glucose-Capillary: 169 mg/dL — ABNORMAL HIGH (ref 70–99)

## 2021-05-13 MED ORDER — ONDANSETRON HCL 4 MG/2ML IJ SOLN: 4.0000 mg | Freq: Once | INTRAMUSCULAR | Status: DC

## 2021-05-13 MED ORDER — IPRATROPIUM-ALBUTEROL 0.5-2.5 (3) MG/3ML IN SOLN
3.0000 mL | Freq: Two times a day (BID) | RESPIRATORY_TRACT | Status: DC
  Administered 2021-05-13 – 2021-05-14 (×2): 3 mL via RESPIRATORY_TRACT
  Filled 2021-05-13 (×2): qty 3

## 2021-05-13 NOTE — Progress Notes (Addendum)
° °  Subjective: I seen and evaluated Evelyn Peterson at bedside.  She endorsed improvement in her breathing especially with ambulation.  She states that she believes what triggered her exacerbation is her home renovation and lots of dust.  She had no relief when using her inhalers and she was very scared, she thought she was going to die.  She endorsed that at baseline, home oxygen 2 L and believes she may need a new oxygen requirement.  Otherwise, she is eating and drinking appropriately and endorse a bowel movement last night.  Objective:   Vital signs in last 24 hours: Vitals:   05/13/21 0344 05/13/21 0558 05/13/21 0825 05/13/21 0850  BP:  123/63  130/74  Pulse:  82  80  Resp:  (!) 21 20 19   Temp:  97.7 F (36.5 C)  97.9 F (36.6 C)  TempSrc:  Oral  Oral  SpO2: 91% 94%  90%   Physical Exam Constitutional:      General: She is awake.     Interventions: Nasal cannula in place.  Cardiovascular:     Rate and Rhythm: Normal rate and regular rhythm.     Heart sounds: Normal heart sounds.  Pulmonary:     Effort: Pulmonary effort is normal.     Breath sounds: Normal air entry. Wheezing present.  Abdominal:     General: Abdomen is flat.     Palpations: Abdomen is soft.     Tenderness: There is no abdominal tenderness.  Musculoskeletal:     Right lower leg: No edema.     Left lower leg: No edema.  Skin:    General: Skin is warm and dry.  Neurological:     General: No focal deficit present.     Mental Status: She is alert.  Psychiatric:        Behavior: Behavior is cooperative.     Assessment/Plan:  Principal Problem:   COPD exacerbation (HCC) Active Problems:   Tobacco use   Respiratory failure, acute-on-chronic (HCC)   Aortic atherosclerosis (HCC)   COPD exacerbation Repeat VBG overnight revealed no significant changes; CO2 76; Bicarb 42 with pH 7.35.  Primary respiratory acidosis with metabolic compensation; chronically compensated. If patient continues to improve  clinically, can switch from IV sulo-medral to oral prednisone. Physical therapy/occupational therapy evaluated the patient and reccomends no outpatient follow up. At baseline, pt O2 supplementation is 2L. During exam, oxygen levels were turned down from 3 L to 2 L and patient continued to saturate well over 94%. Will obtain ambulatory pulse ox to determine if patient may need new oxygen requirement at rest versus activity. -- Ambulatory pulse ox pending --Pulmicort 2mg  Q12H --Duoneb Q4H --Azithromycin 500mg  daily; End date: 05/15/21 --Oral prednisone 40 mg; end date: 05/17/2021  Prediabetes A1c obtained on admission, 5.5%. Pt not on any diabetic medications. Will continue patient on SSI in the setting of steriod use; which can elevate glucose levels. --SSI  Tobacco use -- Encourage cessation --Pt declined nicotine replacement therapy on admission  Prior to Admission Living Arrangement: Anticipated Discharge Location: Barriers to Discharge: Dispo: Anticipated discharge in approximately 1-2 day(s).   , MD 05/13/2021, 10:47 AM Pager: [phone] After 5pm on weekdays and 1pm on weekends: On Call pager [phone]

## 2021-05-13 NOTE — Evaluation (Addendum)
Occupational Therapy Evaluation Patient Details Name: Evelyn Peterson MRN: [id number] DOB: [birth year] Today's Date: 05/13/2021   History of Present Illness Patient is a 61 y/o female presenting on 05/12/21 following a COPD exacerbation. PMH includes respiratory failure, COPD, on 2L O2, LE edema, and arthrosclerosis.   Clinical Impression   PTA patient was living with family in a private residence with 2 STE and was grossly I with ADLs/IADLs without AD. Patient has required continuous O2 since last admission 6 months ago with inability to return to work. Patient currently functioning near baseline demonstrating observed ADLs including LB dressing and toileting with Mod I. Patient limited by deficits listed below including decreased activity tolerance, decreased knowledge of energy conservation techniques, poor activity pacing and decreased cardiopulmonary status requiring 2-4L O2 via [place] continuously and would benefit from continued acute OT services in prep for safe d/c home. Patient is currently uninsured and would benefit from max therapy services while admitted.      Recommendations for follow up therapy are one component of a multi-disciplinary discharge planning process, led by the attending physician.  Recommendations may be updated based on patient status, additional functional criteria and insurance authorization.   Follow Up Recommendations  No OT follow up    Assistance Recommended at Discharge PRN  Patient can return home with the following      Functional Status Assessment  Patient has had a recent decline in their functional status and demonstrates the ability to make significant improvements in function in a reasonable and predictable amount of time.  Equipment Recommendations  BSC/3in1    Recommendations for Other Services       Precautions / Restrictions Precautions Precautions: Other (comment) Precaution Comments: watch O2 Restrictions Weight Bearing Restrictions:  No      Mobility Bed Mobility Overal bed mobility: Modified Independent                  Transfers Overall transfer level: Modified independent Equipment used: None                      Balance Overall balance assessment: Needs assistance Sitting-balance support: Feet unsupported, Single extremity supported Sitting balance-Leahy Scale: Good     Standing balance support: No upper extremity supported, During functional activity Standing balance-Leahy Scale: Fair                             ADL either performed or assessed with clinical judgement   ADL Overall ADL's : Needs assistance/impaired Eating/Feeding: Independent   Grooming: Modified independent Grooming Details (indicate cue type and reason): Standing at sink level Upper Body Bathing: Modified independent   Lower Body Bathing: Modified independent   Upper Body Dressing : Modified independent   Lower Body Dressing: Modified independent   Toilet Transfer: Modified Independent   Toileting- Clothing Manipulation and Hygiene: Modified independent         General ADL Comments: Grossly Mod I without AD. Poor activity tolernace. Moves very quickly and does not initiate rest breaks. Would benefit from education on energy conservation strategies.     Vision   Vision Assessment?: No apparent visual deficits     Perception     Praxis      Pertinent Vitals/Pain Pain Assessment Pain Assessment: No/denies pain     Hand Dominance Right   Extremity/Trunk Assessment Upper Extremity Assessment Upper Extremity Assessment: Overall WFL for tasks assessed   Lower Extremity Assessment Lower  Extremity Assessment: Defer to PT evaluation   Cervical / Trunk Assessment Cervical / Trunk Assessment: Kyphotic   Communication Communication Communication: No difficulties   Cognition Arousal/Alertness: Awake/alert Behavior During Therapy: WFL for tasks assessed/performed Overall Cognitive  Status: Within Functional Limits for tasks assessed                                       General Comments  SpO2 91% on 2L at rest upon entry. Desat to 88% on 2L with mobility in hallway. Titrated up to 4L with SpO2 improving to 90%.    Exercises     Shoulder Instructions      Home Living Family/patient expects to be discharged to:: Private residence Living Arrangements: Other relatives Available Help at Discharge: Family;Available 24 hours/day (Sister-in-law and brother) Type of Home: House Home Access: Stairs to enter [employer] of Steps: 2 Entrance Stairs-Rails: None Home Layout: Two level Alternate Level Stairs-Number of Steps: 10 (has chair lift) Alternate Level Stairs-Rails: Right Bathroom Shower/Tub: Chief Strategy Officer: Standard     Home Equipment: Grab bars - tub/shower;Other (comment) (home O2)   Additional Comments: have a chair lift on stairs      Prior Functioning/Environment Prior Level of Function : Independent/Modified Independent             Mobility Comments: on 2L O2 via nasal cannula around house. ADLs Comments: I with ADLs/IADLs; does grocery shopping; uanble to return to work after last admission 6 months ago. Was only using O2 at night prior to previous admission. Requires continuous O2 since d/c.        OT Problem List: Cardiopulmonary status limiting activity;Decreased activity tolerance      OT Treatment/Interventions: Self-care/ADL training;Therapeutic exercise;Energy conservation;Patient/family education    OT Goals(Current goals can be found in the care plan section) Acute Rehab OT Goals Patient Stated Goal: To return home. OT Goal Formulation: With patient Time For Goal Achievement: 05/27/21 Potential to Achieve Goals: Good ADL Goals Additional ADL Goal #1: Patient will recall and demo 3 energy conservation techniques in prep for ADLs/IADLs. Additional ADL Goal #2: Patient will tolerate  15 minutes of therapeutic activity with no significant changes in SpO2 on 2-4L via [place] indicating improved activity tolerance.  OT Frequency: Min 2X/week    Co-evaluation              AM-PAC OT "6 Clicks" Daily Activity     Outcome Measure Help from another person eating meals?: None Help from another person taking care of personal grooming?: None Help from another person toileting, which includes using toliet, bedpan, or urinal?: None Help from another person bathing (including washing, rinsing, drying)?: None Help from another person to put on and taking off regular upper body clothing?: None Help from another person to put on and taking off regular lower body clothing?: None 6 Click Score: 24   End of Session Equipment Utilized During Treatment: Gait belt;Oxygen (2L at rest and 4L with activity) Nurse Communication: Mobility status  Activity Tolerance: Patient tolerated treatment well Patient left: in chair;with call bell/phone within reach  OT Visit Diagnosis: Muscle weakness (generalized) (M62.81)                Time: 3532-9924 OT Time Calculation (min): 19 min Charges:  OT General Charges $OT Visit: 1 Visit OT Evaluation $OT Eval Low Complexity: 1 Low  Gerson Fauth H. OTR/L Supplemental OT,  Department of rehab services [phone]  Kyson Kupper R H. 05/13/2021, 8:15 AM

## 2021-05-13 NOTE — TOC Benefit Eligibility Note (Signed)
Findings of benefits investigation via test claims at [facility]:  Stiolto Respimat: $463.09 Anoro Elipta: Step therapy required. Must try/fail Bevespi AND Stiolto. PA required as well. Bevespi: $413 Trelegy: requires a PA Breztri: $617.33 Spiriva Respimat: $504.84 Incruse Ellipta: $349.43 Yupelri: Not on formulary and would need a PA

## 2021-05-13 NOTE — Discharge Summary (Addendum)
Name: Evelyn Peterson MRN: [id number] DOB: April 08, 1960 61 y.o. PCP: Claiborne Rigg, NP  Date of Admission: 05/12/2021  5:50 AM Date of Discharge: 05/14/21 Attending Physician: Gust Rung, DO  Discharge Diagnosis: 1. Acute on Chronic Hypoxic respiratory Failure 2/2 COPD exacerbation  Discharge Medications: Allergies as of 05/14/2021   No Known Allergies      Medication List     STOP taking these medications    fluticasone-salmeterol 250-50 MCG/ACT Aepb Commonly known as: Advair Diskus       TAKE these medications    albuterol 108 (90 Base) MCG/ACT inhaler Commonly known as: VENTOLIN HFA Inhale 2 puffs into the lungs every 6 (six) hours as needed for wheezing or shortness of breath. What changed:  how much to take when to take this additional instructions Another medication with the same name was removed. Continue taking this medication, and follow the directions you see here.   azithromycin 500 MG tablet Commonly known as: ZITHROMAX Take 1 tablet (500 mg total) by mouth daily for 2 days. Start taking on: May 15, 2021   budesonide-formoterol 160-4.5 MCG/ACT inhaler Commonly known as: SYMBICORT Inhale 2 puffs into the lungs 2 (two) times daily. What changed: when to take this   Mucinex Maximum Strength 1200 MG Tb12 Generic drug: Guaifenesin Take 1,200 mg by mouth 2 (two) times daily as needed (for coughing or to loosen phlegm).   predniSONE 20 MG tablet Commonly known as: DELTASONE Take 2 tablets (40 mg total) by mouth daily with breakfast for 2 days. Start taking on: May 15, 2021               Durable Medical Equipment  (From admission, onward)           Start     Ordered   05/14/21 1146  For home use only DME oxygen  Once       Question Answer Comment  Length of Need 6 Months   Mode or (Route) Nasal cannula   Liters per Minute 2   Frequency Continuous (stationary and portable oxygen unit needed)   Oxygen delivery system  Gas      05/14/21 1145            Disposition and follow-up:   Evelyn Peterson was discharged from [facility] in Stable condition.  At the hospital follow up visit please address:  1.  Follow up with pulmonology 4-6 weeks after discharge with PFTs  2.  Labs / imaging needed at time of follow-up: None  3.  Pending labs/ test needing follow-up: None  Follow-up Appointments:   Hospital Course by problem list:  60yo woman with COPD & tobacco use, on 2Lnc O2 at baseline, who presents with acute, progressive shortness of breath, productive cough, and increased sputum production, with wheezes and increased oxygen requirement, consistent with a COPD exacerbation.   Patient presented with symptoms consistent with COPD exacerbation and was admitted for further treatment.  On admission patient required 5 L oxygen to maintain O2 sats greater than 88%.  At baseline patient uses 2 L supplemental oxygen.  VBG obtained consistent with respiratory acidosis with metabolic compensation.  Normal pH suggests chronic process.  Patient received IV Solu-Medrol, started on azithromycin, Pulmicort nebs and scheduled DuoNebs. Patient improved during hospitalization. Patient was saturating well on 2L O2 at rest. Ambulatory pulse ox indicates that patient needs O2 to maintain saturations above 85% while ambulating.  At time of discharge patient was back  at baseline; saturating well on 2 L oxygen.   Discharge Exam:   BP 119/66 (BP Location: Left Wrist)    Pulse 88    Temp 97.6 F (36.4 C) (Oral)    Resp 19    SpO2 90%  Discharge exam: Physical Exam Constitutional:      General: She is awake.     Interventions: Nasal cannula in place.  Cardiovascular:     Rate and Rhythm: Normal rate and regular rhythm.     Heart sounds: Normal heart sounds.  Pulmonary:     Effort: No tachypnea or respiratory distress.     Breath sounds: Normal air entry. Examination of the right-lower field reveals  decreased breath sounds. Examination of the left-lower field reveals decreased breath sounds. Decreased breath sounds present.  Musculoskeletal:     Right lower leg: No edema.     Left lower leg: No edema.  Skin:    General: Skin is warm and dry.  Neurological:     General: No focal deficit present.     Mental Status: She is alert.  Psychiatric:        Behavior: Behavior normal. Behavior is cooperative.     Pertinent Labs, Studies, and Procedures:  CBC Latest Ref Rng & Units 05/14/2021 05/13/2021 05/12/2021  WBC 4.0 - 10.5 K/uL 11.7(H) 12.7(H) -  Hemoglobin 12.0 - 15.0 g/dL 14.1 14.3 16.0(H)  Hematocrit 36.0 - 46.0 % 44.2 46.3(H) 47.0(H)  Platelets 150 - 400 K/uL 234 257 -   BMP Latest Ref Rng & Units 05/13/2021 05/12/2021 05/12/2021  Glucose 70 - 99 mg/dL 178(H) - 162(H)  BUN 8 - 23 mg/dL 14 - 9  Creatinine 0.44 - 1.00 mg/dL 0.69 - 0.57  BUN/Creat Ratio 12 - 28 - - -  Sodium 135 - 145 mmol/L 141 138 138  Potassium 3.5 - 5.1 mmol/L 4.2 4.4 4.0  Chloride 98 - 111 mmol/L 98 - 96(L)  CO2 22 - 32 mmol/L 36(H) - 33(H)  Calcium 8.9 - 10.3 mg/dL 9.2 - 9.2     Discharge Instructions:  Please continue taking prednisone 40 mg; end date 05/17/2021 Please continue taking azithromycin; end date 05/16/2021 Once you have insurance, please follow-up with the pulmonologist (lung doctor) for pulmonary rehab and pulmonary function test. Continue to use your Symbicort; once you are able to get the [facility], you can use that from now on and stop the Symbicort.    Discharge Instructions     Call MD for:  difficulty breathing, headache or visual disturbances   Complete by: As directed    Call MD for:  extreme fatigue   Complete by: As directed    Call MD for:  hives   Complete by: As directed    Call MD for:  persistant dizziness or light-headedness   Complete by: As directed    Call MD for:  persistant nausea and vomiting   Complete by: As directed    Call MD for:  redness, tenderness, or  signs of infection (pain, swelling, redness, odor or green/yellow discharge around incision site)   Complete by: As directed    Call MD for:  severe uncontrolled pain   Complete by: As directed    Call MD for:  temperature >100.4   Complete by: As directed    Diet - low sodium heart healthy   Complete by: As directed    Increase activity slowly   Complete by: As directed        Signed: Timothy Lasso, MD  05/14/2021, 11:50 AM   Pager: [phone]

## 2021-05-13 NOTE — TOC Initial Note (Signed)
Transition of Care [facility]) - Initial/Assessment Note    Patient Details  Name: Evelyn Peterson MRN: [id number] Date of Birth: 29-Sep-1960  Transition of Care [facility]) CM/SW Contact:    Lawerance Sabal, RN Phone Number: 05/13/2021, 4:41 PM  Clinical Narrative:           Patient from home on chronic oxygen.  Uninsured.  PCP- Meredeth Ide at [facility]. TOC will continue to follow          Expected Discharge Plan: Home/Self Care Barriers to Discharge: Continued Medical Work up   Patient Goals and CMS Choice        Expected Discharge Plan and Services Expected Discharge Plan: Home/Self Care                                              Prior Living Arrangements/Services                  Current home services: DME    Activities of Daily Living Home Assistive Devices/Equipment: Oxygen ADL Screening (condition at time of admission) Patient's cognitive ability adequate to safely complete daily activities?: Yes Is the patient deaf or have difficulty hearing?: No Does the patient have difficulty seeing, even when wearing glasses/contacts?: No Does the patient have difficulty concentrating, remembering, or making decisions?: No Patient able to express need for assistance with ADLs?: No Does the patient have difficulty dressing or bathing?: No Independently performs ADLs?: Yes (appropriate for developmental age) Does the patient have difficulty walking or climbing stairs?: Yes (depends sometimes) Weakness of Legs: None Weakness of Arms/Hands: None  Permission Sought/Granted                  Emotional Assessment              Admission diagnosis:  COPD exacerbation (HCC) [J44.1] Patient Active Problem List   Diagnosis Date Noted   COPD exacerbation (HCC) 05/12/2021   Respiratory failure, acute-on-chronic (HCC) 05/12/2021   Aortic atherosclerosis (HCC) 05/12/2021   CAP (community acquired pneumonia) 10/27/2020   Acute respiratory failure with hypoxia and  hypercapnia (HCC) 10/27/2020   Tobacco use 11/15/2018   COPD with acute exacerbation (HCC) 03/28/2018   PCP:  Claiborne Rigg, NP Pharmacy:   [facility] [phone] - [place], [place] - [address] [place] [address] Phone: [phone] Fax: [phone]  [facility] [address], [address] [place] [address] Phone: [phone] Fax: [phone]  [id number] [place], [place] - [address] AT [facility] [address] [place] [address] Phone: [phone] Fax: [phone]     Social Determinants of Health (SDOH) Interventions    Readmission Risk Interventions No flowsheet data found.

## 2021-05-13 NOTE — Progress Notes (Signed)
SATURATION QUALIFICATIONS: (This note is used to comply with regulatory documentation for home oxygen)  Patient Saturations on Room Air at Rest = 88%  Patient Saturations on Room Air while Ambulating = 85%  Patient Saturations on 2 Liters of oxygen while Ambulating = 92%  Please briefly explain why patient needs home oxygen: Patient needs oxygen to maintain saturations above 85% while ambulating.

## 2021-05-14 ENCOUNTER — Other Ambulatory Visit (HOSPITAL_COMMUNITY): Payer: Self-pay

## 2021-05-14 DIAGNOSIS — I700 Atherosclerosis of aorta: Secondary | ICD-10-CM

## 2021-05-14 DIAGNOSIS — J9621 Acute and chronic respiratory failure with hypoxia: Secondary | ICD-10-CM

## 2021-05-14 DIAGNOSIS — J9622 Acute and chronic respiratory failure with hypercapnia: Secondary | ICD-10-CM

## 2021-05-14 DIAGNOSIS — J441 Chronic obstructive pulmonary disease with (acute) exacerbation: Principal | ICD-10-CM

## 2021-05-14 LAB — CBC WITH DIFFERENTIAL/PLATELET
Abs Immature Granulocytes: 0.05 10*3/uL (ref 0.00–0.07)
Basophils Absolute: 0 10*3/uL (ref 0.0–0.1)
Basophils Relative: 0 %
Eosinophils Absolute: 0 10*3/uL (ref 0.0–0.5)
Eosinophils Relative: 0 %
HCT: 44.2 % (ref 36.0–46.0)
Hemoglobin: 14.1 g/dL (ref 12.0–15.0)
Immature Granulocytes: 0 %
Lymphocytes Relative: 16 %
Lymphs Abs: 1.9 10*3/uL (ref 0.7–4.0)
MCH: 32.6 pg (ref 26.0–34.0)
MCHC: 31.9 g/dL (ref 30.0–36.0)
MCV: 102.3 fL — ABNORMAL HIGH (ref 80.0–100.0)
Monocytes Absolute: 0.6 10*3/uL (ref 0.1–1.0)
Monocytes Relative: 5 %
Neutro Abs: 9.2 10*3/uL — ABNORMAL HIGH (ref 1.7–7.7)
Neutrophils Relative %: 79 %
Platelets: 234 10*3/uL (ref 150–400)
RBC: 4.32 MIL/uL (ref 3.87–5.11)
RDW: 12.4 % (ref 11.5–15.5)
WBC: 11.7 10*3/uL — ABNORMAL HIGH (ref 4.0–10.5)
nRBC: 0 % (ref 0.0–0.2)

## 2021-05-14 LAB — GLUCOSE, CAPILLARY
Glucose-Capillary: 142 mg/dL — ABNORMAL HIGH (ref 70–99)
Glucose-Capillary: 130 mg/dL — ABNORMAL HIGH (ref 70–99)

## 2021-05-14 MED ORDER — AZITHROMYCIN 500 MG PO TABS
500.0000 mg | ORAL_TABLET | Freq: Every day | ORAL | 0 refills | Status: AC
  Filled 2021-05-14: qty 2, 2d supply, fill #0

## 2021-05-14 MED ORDER — PREDNISONE 20 MG PO TABS: 40.0000 mg | ORAL_TABLET | Freq: Every day | ORAL | 0 refills | Status: DC

## 2021-05-14 MED ORDER — PREDNISONE 20 MG PO TABS
40.0000 mg | ORAL_TABLET | Freq: Every day | ORAL | 0 refills | Status: AC
  Filled 2021-05-14: qty 4, 2d supply, fill #0

## 2021-05-14 MED ORDER — ALBUTEROL SULFATE HFA 108 (90 BASE) MCG/ACT IN AERS
2.0000 | INHALATION_SPRAY | Freq: Four times a day (QID) | RESPIRATORY_TRACT | 1 refills | Status: DC | PRN
  Filled 2021-05-14: qty 18, 30d supply, fill #0

## 2021-05-14 MED ORDER — AZITHROMYCIN 500 MG PO TABS: 500.0000 mg | ORAL_TABLET | Freq: Every day | ORAL | 0 refills | Status: DC

## 2021-05-14 MED ORDER — BUDESONIDE-FORMOTEROL FUMARATE 160-4.5 MCG/ACT IN AERO: 2.0000 | INHALATION_SPRAY | Freq: Two times a day (BID) | RESPIRATORY_TRACT | 12 refills | Status: DC

## 2021-05-14 MED ORDER — BUDESONIDE-FORMOTEROL FUMARATE 160-4.5 MCG/ACT IN AERO
2.0000 | INHALATION_SPRAY | Freq: Two times a day (BID) | RESPIRATORY_TRACT | 12 refills | Status: DC
  Filled 2021-05-14: qty 10.2, 30d supply, fill #0

## 2021-05-14 MED ORDER — AZITHROMYCIN 500 MG PO TABS
500.0000 mg | ORAL_TABLET | Freq: Every day | ORAL | Status: DC
  Administered 2021-05-14: 500 mg via ORAL
  Filled 2021-05-14: qty 1

## 2021-05-14 NOTE — Progress Notes (Signed)
IV removed. Patient educated on any new or change in medication and on all follow appointments. Patient discharge with home medication and home 02. No complaints or concerns stated at this time.

## 2021-05-14 NOTE — Progress Notes (Addendum)
Physical Therapy Treatment Patient Details Name: Evelyn Peterson MRN: [id number] DOB: 1960-05-24 Today's Date: 05/14/2021   History of Present Illness Patient is a 61 y/o female presenting on 05/12/21 following a COPD exacerbation. PMH includes respiratory failure, COPD, on 2L O2, LE edema, and arthrosclerosis.    PT Comments    Patient demonstrated independent ambulation in room on oxygen extension tubing that reaches the bathroom. Refused instruction in use of rollator as she does not feel she needs one for use in the community. She refused steps as she feels she will not have a problem and is going home today. She did walk 300 ft on 2L O2 with lowest sats 89%. Patient was maintaining conversation entire walk with only mild dyspnea. Patient asking about how to get a portable oxygen tank. She reports she was never given any and only has a concentrator at home. Will notify TOC team re: her request and refusal of rollator.     Recommendations for follow up therapy are one component of a multi-disciplinary discharge planning process, led by the attending physician.  Recommendations may be updated based on patient status, additional functional criteria and insurance authorization.  Follow Up Recommendations  No PT follow up     Assistance Recommended at Discharge PRN  Patient can return home with the following Assistance with cooking/housework;Assist for transportation;Help with stairs or ramp for entrance   Equipment Recommendations  None recommended by PT (pt refused instruction re: rollator; does not want one)    Recommendations for Other Services Other (comment) (Pt would benefit from pulmonary rehab)     Precautions / Restrictions Precautions Precautions: Other (comment) Precaution Comments: watch O2 Restrictions Weight Bearing Restrictions: No     Mobility  Bed Mobility                    Transfers Overall transfer level: Modified independent Equipment used:  None Transfers: Sit to/from Stand Sit to Stand: Independent                Ambulation/Gait Ambulation/Gait assistance: Independent Gait Distance (Feet): 300 Feet Assistive device: None (pt pulled her O2 tank) Gait Pattern/deviations: Decreased step length - left, Decreased step length - right, Decreased stride length, Narrow base of support Gait velocity: decreased     General Gait Details: Patient was on 2L O2 w/ nasal cannula during ambulation with lowest sats 89%. Upon return to room, O2 sats at 90%.   Stairs Stairs:  (pt deferred; did not feel she needed to do them before going home today)           Wheelchair Mobility    Modified Rankin (Stroke Patients Only)       Balance Overall balance assessment: Needs assistance Sitting-balance support: Feet unsupported, Single extremity supported Sitting balance-Leahy Scale: Good     Standing balance support: No upper extremity supported, During functional activity Standing balance-Leahy Scale: Fair                              Cognition Arousal/Alertness: Awake/alert Behavior During Therapy: WFL for tasks assessed/performed Overall Cognitive Status: Within Functional Limits for tasks assessed                                          Exercises      General Comments General comments (skin integrity,  edema, etc.): Per last therapist note, took rollator to room to instruct pt in use. She reported she has no interest in using a rollator and does not feel she needs it for a seated rest break in community. Pt refused education re: rollator use.      Pertinent Vitals/Pain Pain Assessment Faces Pain Scale: No hurt    Home Living                          Prior Function            PT Goals (current goals can now be found in the care plan section) Acute Rehab PT Goals Patient Stated Goal: to go home PT Goal Formulation: With patient Time For Goal Achievement:  05/26/21 Potential to Achieve Goals: Good Progress towards PT goals: Goals met/education completed, patient discharged from PT    Frequency    Min 3X/week      PT Plan Current plan remains appropriate    Co-evaluation              AM-PAC PT "6 Clicks" Mobility   Outcome Measure  Help needed turning from your back to your side while in a flat bed without using bedrails?: None Help needed moving from lying on your back to sitting on the side of a flat bed without using bedrails?: None Help needed moving to and from a bed to a chair (including a wheelchair)?: None Help needed standing up from a chair using your arms (e.g., wheelchair or bedside chair)?: None Help needed to walk in hospital room?: None Help needed climbing 3-5 steps with a railing? : A Little 6 Click Score: 23    End of Session Equipment Utilized During Treatment: Oxygen (2L O2 w/ nasal cannula) Activity Tolerance: Patient tolerated treatment well Patient left: in bed;with call bell/phone within reach (sitting EOB waiting for lunch)   PT Visit Diagnosis: Unsteadiness on feet (R26.81);Other abnormalities of gait and mobility (R26.89)     PT Discharge Note  Patient is being discharged from PT services secondary to:  Goals met and no further therapy needs identified.  Please see latest Therapy Progress Note for current level of functioning and progress toward goals.  Progress and discharge plan and discussed with patient/caregiver and they  Agree  Time: 1219-7588 PT Time Calculation (min) (ACUTE ONLY): 13 min  Charges:  $Gait Training: 8-22 mins                      Arby Barrette, PT [place]  Pager [phone] Office [phone]    Evelyn Peterson 05/14/2021, 12:08 PM

## 2021-05-14 NOTE — TOC Transition Note (Addendum)
Transition of Care [facility]) - CM/SW Discharge Note   Patient Details  Name: Evelyn Peterson MRN: [id number] Date of Birth: 24-Apr-1960  Transition of Care [facility]) CM/SW Contact:  Lockie Pares, RN Phone Number: 05/14/2021, 1:27 PM   Clinical Narrative:    Discussed with patient as far as needs, going home. She has applied for Medicaid, she is waiting on response. Gets Ssi check, is behind on rent, will need assistance with Medications. Messaged MD to change meds to [facility] pharmacy, [facility] and overides put in and verbalized to [facility] pharmacy. [employer] called to bring portable oxygen for transport home. RN messaged to let them know of DC plan.. Daughter can transport home, she works until [employer].  1630 No oxygen yet, [employer] called left message, then did get a hold of someone, they were unable to find her in the system. The daughter will need to bring oxygen for transport home    Barriers to Discharge: Continued Medical Work up   Patient Goals and CMS Choice        Discharge Placement                       Discharge Plan and Services                DME Arranged: Oxygen DME Agency: [employer] Date DME Agency Contacted: 05/14/21 Time DME Agency Contacted: 1302 Representative spoke with at DME Agency: Velna Hatchet            Social Determinants of Health (SDOH) Interventions     Readmission Risk Interventions No flowsheet data found.

## 2021-05-15 ENCOUNTER — Telehealth: Payer: Self-pay

## 2021-05-15 ENCOUNTER — Telehealth: Payer: Self-pay | Admitting: Nurse Practitioner

## 2021-05-15 NOTE — Telephone Encounter (Signed)
Transition Care Management Unsuccessful Follow-up Telephone Call  Date of discharge and from where:  [facility] on 05/14/2021  Attempts:  1st Attempt  Reason for unsuccessful TCM follow-up call:  unable to reach or leave VM on [phone]   Has appt [facility] scheduled on 05/29/2021 with Dr Alvis Lemmings

## 2021-05-15 NOTE — Telephone Encounter (Signed)
Medication Refill - Medication:  ipratropium-albuterol (DUONEB) 0.5-2.5 (3) MG/3ML nebulizer solution 3 mL  Has the patient contacted their pharmacy? Yes.   Contact PCP  Preferred Pharmacy (with phone number or street name):  [facility] [place], [place] [facility] Phone:  [phone]  Fax:  [phone]      Has the patient been seen for an appointment in the last year OR does the patient have an upcoming appointment? Yes.    Agent: Please be advised that RX refills may take up to 3 business days. We ask that you follow-up with your pharmacy.

## 2021-05-17 LAB — CULTURE, BLOOD (SINGLE)
Culture: NO GROWTH
Special Requests: ADEQUATE

## 2021-05-18 ENCOUNTER — Telehealth: Payer: Self-pay

## 2021-05-18 NOTE — Telephone Encounter (Signed)
Routing to PCP

## 2021-05-18 NOTE — Telephone Encounter (Signed)
Transition Care Management Unsuccessful Follow-up Telephone Call  Date of discharge and from where:  05/14/2021, [facility] Attempts:  2nd Attempt  Reason for unsuccessful TCM follow-up call:  Unable to leave message, voicemail not set up.   Patient has televisit scheduled with Geryl Rankins, NP tomorrow - 05/19/2021.

## 2021-05-19 ENCOUNTER — Telehealth: Payer: Self-pay

## 2021-05-19 ENCOUNTER — Other Ambulatory Visit: Payer: Self-pay

## 2021-05-19 ENCOUNTER — Ambulatory Visit: Payer: Medicaid Other | Attending: Nurse Practitioner | Admitting: Nurse Practitioner

## 2021-05-19 ENCOUNTER — Encounter: Payer: Self-pay | Admitting: Nurse Practitioner

## 2021-05-19 DIAGNOSIS — J441 Chronic obstructive pulmonary disease with (acute) exacerbation: Secondary | ICD-10-CM | POA: Diagnosis not present

## 2021-05-19 DIAGNOSIS — Z09 Encounter for follow-up examination after completed treatment for conditions other than malignant neoplasm: Secondary | ICD-10-CM | POA: Diagnosis not present

## 2021-05-19 MED ORDER — ALBUTEROL SULFATE (2.5 MG/3ML) 0.083% IN NEBU: 2.5000 mg | INHALATION_SOLUTION | Freq: Four times a day (QID) | RESPIRATORY_TRACT | 1 refills | Status: DC | PRN

## 2021-05-19 MED ORDER — PREDNISONE 20 MG PO TABS: 40.0000 mg | ORAL_TABLET | Freq: Every day | ORAL | 0 refills | Status: AC

## 2021-05-19 NOTE — Progress Notes (Signed)
Virtual Visit via Telephone Note Due to national recommendations of social distancing due to COVID 19, telehealth visit is felt to be most appropriate for this patient at this time.  I discussed the limitations, risks, security and privacy concerns of performing an evaluation and management service by telephone and the availability of in person appointments. I also discussed with the patient that there may be a patient responsible charge related to this service. The patient expressed understanding and agreed to proceed.    I connected with Evelyn Peterson on 05/19/21  at   8:30 AM EST  EDT by telephone and verified that I am speaking with the correct person using two identifiers.  Location of Patient: Private Residence   Location of Provider: [facility]    Persons participating in Telemedicine visit: Bertram Denver FNP-BC Evelyn Dings Triska    History of Present Illness: Telemedicine visit for: COPD exacerbation  Recently admitted to the hospital for 2 days with acute on chronic hypoxic respiratory failure secondary to COPD exacerbation.  She required 5 L of oxygen to maintain oxygen sats greater than 88%.  Her baseline is 2 L of supplemental O2.  Ambulatory pulse ox indicated that she will need O2 to maintain saturations above 85% while ambulating.  She was sent home on 2 L of O2. Advair was switched to Symbicort, she was instructed to take prednisone 40 mg for 2 days and continue azithromycin for 2 days.  She will need follow-up with pulmonology for PFT.  Ultimately she will need Breztri but unable to afford at this time.  She is currently uninsured.  I did recommend sending her medicines to the community clinic pharmacy for patient assistance program however she requested medication was sent to [facility] today. She is requesting refill of nebulizers and also endorses persistent cough and shortness of breath with wheezing.  Will extend prednisone for 5  additional days.  Patient states she stopped smoking 3 weeks ago.   Patient was urged to apply for the financial assistance program.  They were instructed to inquire at the front desk about the application process for the [place] discount, orange card or other financial assistance.       Past Medical History:  Diagnosis Date   COPD (chronic obstructive pulmonary disease) (HCC)    Edema of lower extremity 03/2018    Past Surgical History:  Procedure Laterality Date   TONSILLECTOMY      Family History  Problem Relation Age of Onset   Conductive hearing loss Mother    Diabetes Mother    Heart disease Mother        AMI/CHF   Hypertension Mother    Diabetes Father    Diabetes Sister    High blood pressure Sister    High blood pressure Sister    Diabetes Sister     Social History   Socioeconomic History   Marital status: Single    Spouse name: Not on file   Number of children: Not on file   Years of education: Not on file   Highest education level: Not on file  Occupational History    Employer: [employer]    Comment: Waitress  Tobacco Use   Smoking status: Former    Packs/day: 1.00    Years: 40.00    Pack years: 40.00    Types: Cigarettes    Quit date: 04/17/2021    Years since quitting: 0.0   Smokeless tobacco: Never  Vaping Use  Vaping Use: Never used  Substance and Sexual Activity   Alcohol use: Yes    Comment: beer- weekly   Drug use: No   Sexual activity: Not Currently  Other Topics Concern   Not on file  Social History Narrative   Not on file   Social Determinants of Health   Financial Resource Strain: Not on file  Food Insecurity: Not on file  Transportation Needs: Not on file  Physical Activity: Not on file  Stress: Not on file  Social Connections: Not on file     Observations/Objective: Awake, alert and oriented x 3   Review of Systems  Constitutional:  Negative for fever, malaise/fatigue and weight loss.  HENT: Negative.  Negative for  nosebleeds.   Eyes: Negative.  Negative for blurred vision, double vision and photophobia.  Respiratory:  Positive for cough, sputum production, shortness of breath and wheezing.   Cardiovascular: Negative.  Negative for chest pain, palpitations and leg swelling.  Gastrointestinal: Negative.  Negative for heartburn, nausea and vomiting.  Musculoskeletal: Negative.  Negative for myalgias.  Neurological: Negative.  Negative for dizziness, focal weakness, seizures and headaches.  Psychiatric/Behavioral: Negative.  Negative for suicidal ideas.    Assessment and Plan: Diagnoses and all orders for this visit:  Hospital discharge follow-up  COPD with acute exacerbation (HCC) -     predniSONE (DELTASONE) 20 MG tablet; Take 2 tablets (40 mg total) by mouth daily with breakfast for 5 days. -     albuterol (PROVENTIL) (2.5 MG/3ML) 0.083% nebulizer solution; Take 3 mLs (2.5 mg total) by nebulization every 6 (six) hours as needed for wheezing or shortness of breath. -     Ambulatory referral to Pulmonology     Follow Up Instructions No follow-ups on file.     I discussed the assessment and treatment plan with the patient. The patient was provided an opportunity to ask questions and all were answered. The patient agreed with the plan and demonstrated an understanding of the instructions.   The patient was advised to call back or seek an in-person evaluation if the symptoms worsen or if the condition fails to improve as anticipated.  I provided 12 minutes of non-face-to-face time during this encounter including median intraservice time, reviewing previous notes, labs, imaging, medications and explaining diagnosis and management.  Claiborne Rigg, FNP-BC

## 2021-05-19 NOTE — Telephone Encounter (Signed)
Transition Care Management Follow-up Telephone Call Date of discharge and from where: 05/14/2021, [facility] Patient had hospital follow up appointment with PCP this morning

## 2021-05-20 ENCOUNTER — Other Ambulatory Visit: Payer: Self-pay | Admitting: Nurse Practitioner

## 2021-05-20 ENCOUNTER — Telehealth: Payer: Self-pay

## 2021-05-20 NOTE — Telephone Encounter (Signed)
Called but vm is not set up to leave a vm

## 2021-05-20 NOTE — Telephone Encounter (Signed)
Please let patient know we don't usually prescribe duonebs. But I have sent her albuterol nebs to pharmacy

## 2021-06-18 ENCOUNTER — Inpatient Hospital Stay: Payer: No Typology Code available for payment source | Admitting: Family Medicine

## 2021-07-08 ENCOUNTER — Ambulatory Visit: Payer: Self-pay | Admitting: Nurse Practitioner

## 2021-09-14 ENCOUNTER — Telehealth: Payer: Self-pay | Admitting: Nurse Practitioner

## 2021-09-14 NOTE — Telephone Encounter (Signed)
Pt is calling to request a new EOB since she has Medicaid [employer]. Please advise

## 2021-09-15 ENCOUNTER — Other Ambulatory Visit: Payer: Self-pay | Admitting: Nurse Practitioner

## 2021-09-15 ENCOUNTER — Encounter: Payer: Self-pay | Admitting: Nurse Practitioner

## 2021-09-15 ENCOUNTER — Telehealth: Payer: No Typology Code available for payment source | Admitting: Family Medicine

## 2021-09-15 ENCOUNTER — Ambulatory Visit: Payer: Self-pay | Admitting: *Deleted

## 2021-09-15 DIAGNOSIS — Z1231 Encounter for screening mammogram for malignant neoplasm of breast: Secondary | ICD-10-CM

## 2021-09-15 DIAGNOSIS — J9611 Chronic respiratory failure with hypoxia: Secondary | ICD-10-CM

## 2021-09-15 DIAGNOSIS — J9621 Acute and chronic respiratory failure with hypoxia: Secondary | ICD-10-CM

## 2021-09-15 DIAGNOSIS — J441 Chronic obstructive pulmonary disease with (acute) exacerbation: Secondary | ICD-10-CM | POA: Diagnosis not present

## 2021-09-15 MED ORDER — PREDNISONE 20 MG PO TABS: 40.0000 mg | ORAL_TABLET | Freq: Every day | ORAL | 0 refills | Status: AC

## 2021-09-15 MED ORDER — BUDESONIDE-FORMOTEROL FUMARATE 160-4.5 MCG/ACT IN AERO: 2.0000 | INHALATION_SPRAY | Freq: Two times a day (BID) | RESPIRATORY_TRACT | 12 refills | Status: DC

## 2021-09-15 NOTE — Telephone Encounter (Addendum)
Attempt to call patient, no answer or voicemail set up.   Seeking clarity on EOB and how we can help.Would advise patient to call insurance company or billing.

## 2021-09-15 NOTE — Telephone Encounter (Signed)
  Chief Complaint: Cough Symptoms: productive cough, onset yesterday,whitish phlegm. SOB with exertion, wheezing at times, out of Symbicort inhaler. Pt on supplemental O2. States more SOB than usual "When working around the house." Frequency: "Congested 3 days ago, cough yesterday." Took Mucinex. Pertinent Negatives: Patient denies fever, no SOB at rest Disposition: [] ED /[] Urgent Care (no appt availability in office) / [] Appointment(In office/virtual)/ [x]  [place] [facility]/ [] Home Care/ [] Refused Recommended Disposition /[] [facility]/ []  Follow-up with PCP Additional Notes: Pt requesting antibiotics and prednisone. No Availability, states cannot get to appt. [facility] secured for pt. Care advise provided, verbalizes understanding.   coReason for Disposition  [1] Known COPD or other severe lung disease (i.e., bronchiectasis, cystic fibrosis, lung surgery) AND [2] worsening symptoms (i.e., increased sputum purulence or amount, increased breathing difficulty  Answer Assessment - Initial Assessment Questions 1. ONSET: "When did the cough begin?"      3 days ago 2. SEVERITY: "How bad is the cough today?"      Cough started yesterday 3. SPUTUM: "Describe the color of your sputum" (none, dry cough; clear, white, yellow, green)     white 4. HEMOPTYSIS: "Are you coughing up any blood?" If so ask: "How much?" (flecks, streaks, tablespoons, etc.)     No 5. DIFFICULTY BREATHING: "Are you having difficulty breathing?" If Yes, ask: "How bad is it?" (e.g., mild, moderate, severe)    - MILD: No SOB at rest, mild SOB with walking, speaks normally in sentences, can lie down, no retractions, pulse < 100.    - MODERATE: SOB at rest, SOB with minimal exertion and prefers to sit, cannot lie down flat, speaks in phrases, mild retractions, audible wheezing, pulse 100-120.    - SEVERE: Very SOB at rest, speaks in single words, struggling to breathe, sitting hunched forward, retractions, pulse >  120      SOB with exertion 6. FEVER: "Do you have a fever?" If Yes, ask: "What is your temperature, how was it measured, and when did it start?"     no 8. LUNG HISTORY: "Do you have any history of lung disease?"  (e.g., pulmonary embolus, asthma, emphysema)     yes 10. OTHER SYMPTOMS: "Do you have any other symptoms?" (e.g., runny nose, wheezing, chest pain)       Wheezing,  Protocols used: Cough - Acute Productive-A-AH

## 2021-09-15 NOTE — Patient Instructions (Signed)
Chronic Obstructive Pulmonary Disease  Chronic obstructive pulmonary disease (COPD) is a long-term (chronic) lung problem. When you have COPD, it is hard for air to get in and out of your lungs. Usually the condition gets worse over time, and your lungs will never return to normal. There are things you can do to keep yourself as healthy as possible. What are the causes? Smoking. This is the most common cause. Certain genes passed from parent to child (inherited). What increases the risk? Being exposed to secondhand smoke from cigarettes, pipes, or cigars. Being exposed to chemicals and other irritants, such as fumes and dust in the work environment. Having chronic lung conditions or infections. What are the signs or symptoms? Shortness of breath, especially during physical activity. A long-term cough with a large amount of thick mucus. Sometimes, the cough may not have any mucus (dry cough). Wheezing. Breathing quickly. Skin that looks gray or blue, especially in the fingers, toes, or lips. Feeling tired (fatigue). Weight loss. Chest tightness. Having infections often. Episodes when breathing symptoms become much worse (exacerbations). At the later stages of this disease, you may have swelling in the ankles, feet, or legs. How is this treated? Taking medicines. Quitting smoking, if you smoke. Rehabilitation. This includes steps to make your body work better. It may involve a team of specialists. Doing exercises. Making changes to your diet. Using oxygen. Lung surgery. Lung transplant. Comfort measures (palliative care). Follow these instructions at home: Medicines Take over-the-counter and prescription medicines only as told by your doctor. Talk to your doctor before taking any cough or allergy medicines. You may need to avoid medicines that cause your lungs to be dry. Lifestyle If you smoke, stop smoking. Smoking makes the problem worse. Do not smoke or use any products that  contain nicotine or tobacco. If you need help quitting, ask your doctor. Avoid being around things that make your breathing worse. This may include smoke, chemicals, and fumes. Stay active, but remember to rest as well. Learn and use tips on how to manage stress and control your breathing. Make sure you get enough sleep. Most adults need at least 7 hours of sleep every night. Eat healthy foods. Eat smaller meals more often. Rest before meals. Controlled breathing Learn and use tips on how to control your breathing as told by your doctor. Try: Breathing in (inhaling) through your nose for 1 second. Then, pucker your lips and breath out (exhale) through your lips for 2 seconds. Putting one hand on your belly (abdomen). Breathe in slowly through your nose for 1 second. Your hand on your belly should move out. Pucker your lips and breathe out slowly through your lips. Your hand on your belly should move in as you breathe out.  Controlled coughing Learn and use controlled coughing to clear mucus from your lungs. Follow these steps: Lean your head a little forward. Breathe in deeply. Try to hold your breath for 3 seconds. Keep your mouth slightly open while coughing 2 times. Spit any mucus out into a tissue. Rest and do the steps again 1 or 2 times as needed. General instructions Make sure you get all the shots (vaccines) that your doctor recommends. Ask your doctor about a flu shot and a pneumonia shot. Use oxygen therapy and pulmonary rehabilitation if told by your doctor. If you need home oxygen therapy, ask your doctor if you should buy a tool to measure your oxygen level (oximeter). Make a COPD action plan with your doctor. This helps you   to know what to do if you feel worse than usual. Manage any other conditions you have as told by your doctor. Avoid going outside when it is very hot, cold, or humid. Avoid people who have a sickness you can catch (contagious). Keep all follow-up  visits. Contact a doctor if: You cough up more mucus than usual. There is a change in the color or thickness of the mucus. It is harder to breathe than usual. Your breathing is faster than usual. You have trouble sleeping. You need to use your medicines more often than usual. You have trouble doing your normal activities such as getting dressed or walking around the house. Get help right away if: You have shortness of breath while resting. You have shortness of breath that stops you from: Being able to talk. Doing normal activities. Your chest hurts for longer than 5 minutes. Your skin color is more blue than usual. Your pulse oximeter shows that you have low oxygen for longer than 5 minutes. You have a fever. You feel too tired to breathe normally. These symptoms may represent a serious problem that is an emergency. Do not wait to see if the symptoms will go away. Get medical help right away. Call your local emergency services (911 in the [place] Do not drive yourself to the hospital. Summary Chronic obstructive pulmonary disease (COPD) is a long-term lung problem. The way your lungs work will never return to normal. Usually the condition gets worse over time. There are things you can do to keep yourself as healthy as possible. Take over-the-counter and prescription medicines only as told by your doctor. If you smoke, stop. Smoking makes the problem worse. This information is not intended to replace advice given to you by your health care provider. Make sure you discuss any questions you have with your health care provider. Document Revised: 01/22/2020 Document Reviewed: 01/22/2020 Elsevier Patient Education  2023 Elsevier Inc.  

## 2021-09-15 NOTE — Progress Notes (Signed)
Virtual Visit Consent   Evelyn Peterson, you are scheduled for a virtual visit with a [facility] provider today. Just as with appointments in the office, your consent must be obtained to participate. Your consent will be active for this visit and any virtual visit you may have with one of our providers in the next 365 days. If you have a MyChart account, a copy of this consent can be sent to you electronically.  As this is a virtual visit, video technology does not allow for your provider to perform a traditional examination. This may limit your provider's ability to fully assess your condition. If your provider identifies any concerns that need to be evaluated in person or the need to arrange testing (such as labs, EKG, etc.), we will make arrangements to do so. Although advances in technology are sophisticated, we cannot ensure that it will always work on either your end or our end. If the connection with a video visit is poor, the visit may have to be switched to a telephone visit. With either a video or telephone visit, we are not always able to ensure that we have a secure connection.  By engaging in this virtual visit, you consent to the provision of healthcare and authorize for your insurance to be billed (if applicable) for the services provided during this visit. Depending on your insurance coverage, you may receive a charge related to this service.  I need to obtain your verbal consent now. Are you willing to proceed with your visit today? Malayzia Laforte Caterino has provided verbal consent on 09/15/2021 for a virtual visit (video or telephone). Freddy Finner, NP  Date: 09/15/2021 2:37 PM  Virtual Visit via Video Note   I, Freddy Finner, connected with  Evelyn Peterson  ([id number], 05/23/1960) on 09/15/21 at  2:30 PM EDT by a video-enabled telemedicine application and verified that I am speaking with the correct person using two identifiers.  Location: Patient: Virtual Visit Location  Patient: Home Provider: Virtual Visit Location Provider: Home Office   I discussed the limitations of evaluation and management by telemedicine and the availability of in person appointments. The patient expressed understanding and agreed to proceed.    History of Present Illness: Evelyn Peterson is a 61 y.o. who identifies as a female who was assigned female at birth, and is being seen today for increased SOE. Cough started yesterday- reports being out of Symbicort inhaler.  Is using her supplement O2. SOBOE is more than her usually. Mild congestion starting up, started mucinex which increased her mucus production- white in color. Mild wheezing. She reports now she is feeling alittle better during the video visit. Talking in complete sentences. Denies chest pain, shortness of breath while at rest, fevers or chills. No other URI signs at this time. Problems:  Patient Active Problem List   Diagnosis Date Noted   COPD exacerbation (HCC) 05/12/2021   Respiratory failure, acute-on-chronic (HCC) 05/12/2021   Aortic atherosclerosis (HCC) 05/12/2021   CAP (community acquired pneumonia) 10/27/2020   Acute respiratory failure with hypoxia and hypercapnia (HCC) 10/27/2020   Tobacco use 11/15/2018   COPD with acute exacerbation (HCC) 03/28/2018    Allergies: No Known Allergies Medications:  Current Outpatient Medications:    albuterol (PROVENTIL) (2.5 MG/3ML) 0.083% nebulizer solution, Take 3 mLs (2.5 mg total) by nebulization every 6 (six) hours as needed for wheezing or shortness of breath., Disp: 150 mL, Rfl: 1   albuterol (VENTOLIN HFA) 108 (  90 Base) MCG/ACT inhaler, Inhale 2 puffs into the lungs every 6 (six) hours as needed for wheezing or shortness of breath., Disp: 18 g, Rfl: 1   budesonide-formoterol (SYMBICORT) 160-4.5 MCG/ACT inhaler, Inhale 2 puffs into the lungs 2 (two) times daily., Disp: 10.2 g, Rfl: 12   MUCINEX MAXIMUM STRENGTH 1200 MG TB12, Take 1,200 mg by mouth 2 (two) times daily  as needed (for coughing or to loosen phlegm)., Disp: , Rfl:   Observations/Objective: Patient is well-developed, well-nourished in no acute distress.  Resting comfortably  at home.  Head is normocephalic, atraumatic.  No labored breathing. Talking in complete sentences -noted oxygen being worn Speech is clear and coherent with logical content.  Patient is alert and oriented at baseline.    Assessment and Plan: 1. COPD with acute exacerbation (HCC)  - predniSONE (DELTASONE) 20 MG tablet; Take 2 tablets (40 mg total) by mouth daily with breakfast for 3 days.  Dispense: 6 tablet; Refill: 0  -Recommended in person eval, reports unable to get a ride to UC or ED; no car or anyone home today. Reports will go tomorrow once she has a ride.   -Does report having someone that can get her medication and bring it to her though. Needs to call in refill of her Symbicort -insurance issues caused her to run out of it.  -Short burst of pred to help with SOE; restart of inhaler should help ease the exacerbation.  -Not starting Abx at this time- mucus is normal (outside use of mucinex bring up more), no fevers or trouble with deep breath, still recommend in person for oxygen check and lungs to be listened to. Strong Hx of COPD and CAP  -reached out to PCP to let them know of situation- PCP is going to call pt now.  Follow Up Instructions: I discussed the assessment and treatment plan with the patient. The patient was provided an opportunity to ask questions and all were answered. The patient agreed with the plan and demonstrated an understanding of the instructions.  A copy of instructions were sent to the patient via MyChart unless otherwise noted below.     The patient was advised to call back or seek an in-person evaluation if the symptoms worsen or if the condition fails to improve as anticipated.  Time:  I spent 25 minutes with the patient via telehealth technology discussing the above  problems/concerns.    Freddy Finner, NP

## 2021-09-16 ENCOUNTER — Ambulatory Visit: Payer: Self-pay | Admitting: Physician Assistant

## 2021-09-28 ENCOUNTER — Ambulatory Visit: Payer: Self-pay | Admitting: Nurse Practitioner

## 2022-01-18 ENCOUNTER — Other Ambulatory Visit: Payer: Self-pay | Admitting: Nurse Practitioner

## 2022-01-18 ENCOUNTER — Telehealth: Payer: Self-pay | Admitting: Pharmacist

## 2022-01-18 DIAGNOSIS — J4489 Other specified chronic obstructive pulmonary disease: Secondary | ICD-10-CM

## 2022-01-18 NOTE — Telephone Encounter (Signed)
Called patient to schedule an appointment for the [employer] [facility]. I was unable to reach the patient so I left a HIPAA-compliant message requesting that the patient return my call.   Luke Van Ausdall, PharmD, BCACP, CPP Clinical Pharmacist [facility] [phone]  

## 2022-01-18 NOTE — Telephone Encounter (Signed)
Medication Refill - Medication: Rx #: [id number]  albuterol (VENTOLIN HFA) 108 (90 Base) MCG/ACT inhaler / she stated her nebulizer broke and she needs another one of those asa well / please advise pt called to book appt but didn't want to wait until Saturday virtual with Benjaman Kindler /   Has the patient contacted their pharmacy? No. (Agent: If no, request that the patient contact the pharmacy for the refill. If patient does not wish to contact the pharmacy document the reason why and proceed with request.) (Agent: If yes, when and what did the pharmacy advise?)  Preferred Pharmacy (with phone number or street name): [id number] [address], [place] AT [address]  [place], [place] [address]  Phone:  [phone]  Fax:  [phone]  DEA #:  [id number] Has the patient been seen for an appointment in the last year OR does the patient have an upcoming appointment? Yes.     Agent: Please be advised that RX refills may take up to 3 business days. We ask that you follow-up with your pharmacy.

## 2022-01-18 NOTE — Telephone Encounter (Signed)
Requested medication (s) are due for refill today: Yes  Requested medication (s) are on the active medication list: Yes  Last refill:  05/14/21  Future visit scheduled: No  Notes to clinic:  Unable to refill per protocol, last refill by another provider. See notes from patient needing inhaler     Requested Prescriptions  Pending Prescriptions Disp Refills   albuterol (VENTOLIN HFA) 108 (90 Base) MCG/ACT inhaler 18 g 1    Sig: Inhale 2 puffs into the lungs every 6 (six) hours as needed for wheezing or shortness of breath.     Pulmonology:  Beta Agonists 2 Failed - 01/18/2022  1:48 PM      Failed - Valid encounter within last 12 months    Recent Outpatient Visits           8 months ago Hospital discharge follow-up   [place], Zelda W, NP   1 year ago Hospital discharge follow-up   [place], Zelda W, NP   2 years ago COPD with acute exacerbation [facility])   [place] Elsie Stain, MD   3 years ago Need for influenza vaccination   [place], RPH-CPP   3 years ago Encounter for Papanicolaou smear for cervical cancer screening   [place], [place] W, NP              Passed - Last BP in normal range    BP Readings from Last 1 Encounters:  05/14/21 129/75         Passed - Last Heart Rate in normal range    Pulse Readings from Last 1 Encounters:  05/14/21 87

## 2022-01-18 NOTE — Telephone Encounter (Signed)
Patient called, no answer, no mailbox, recording call could not be completed as dialed, try call again later.

## 2022-08-10 IMAGING — CT CT CHEST NODULE FOLLOW UP LOW DOSE W/O CM
1 series · 10 of 10 positions shown, 13 images · non-contrast
Comparison: Chest CT dated October 27, 2020

CLINICAL DATA: Current smoker

EXAM:
CT CHEST WITHOUT CONTRAST
TECHNIQUE: Multidetector CT imaging of the chest was performed following the
standard protocol without IV contrast.

[ct lung segmentation data · axial · 0.91mm/px · z∈[+1272,+1272]mm · 10 of 341 frames shown]
[frame 1/341  mediastinal]
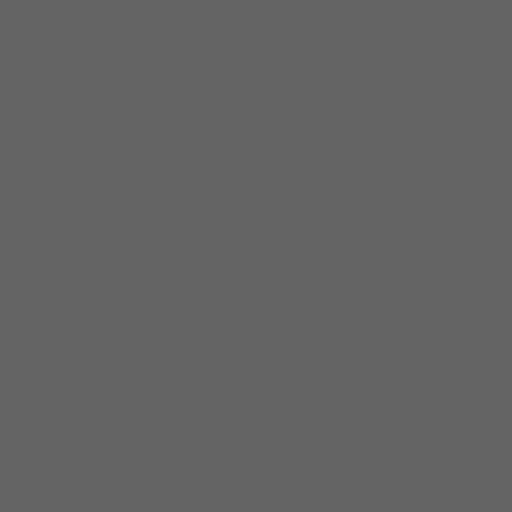
[frame 1/341  lung]
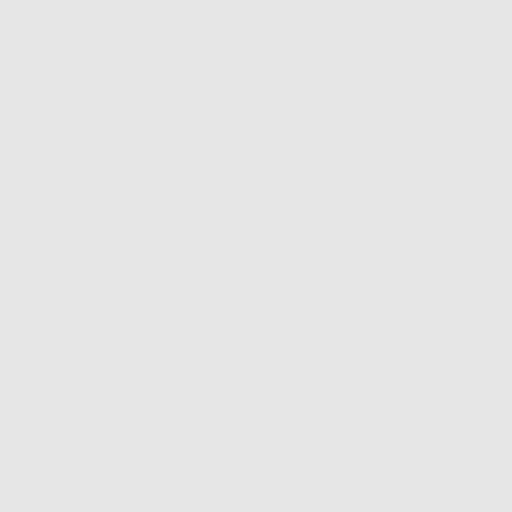
[frame 38/341  lung]
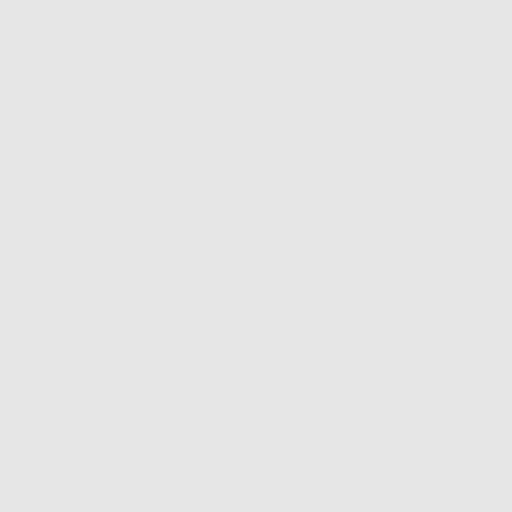
[frame 76/341  lung]
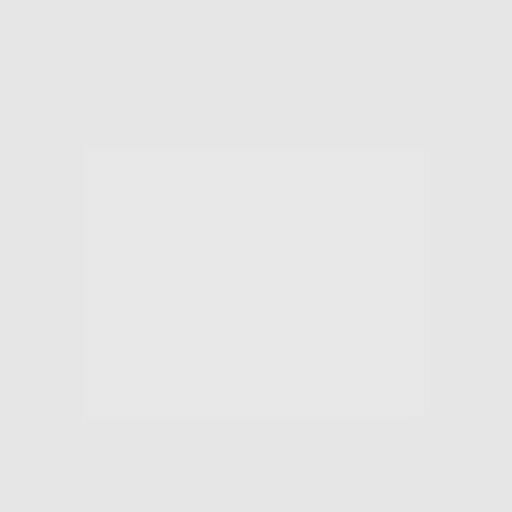
[frame 114/341  lung]
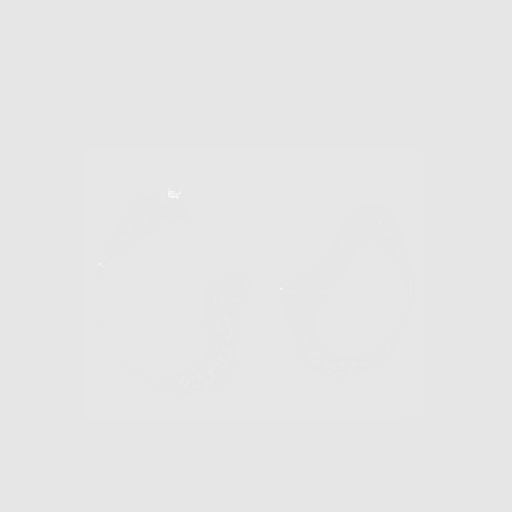
[frame 152/341  mediastinal]
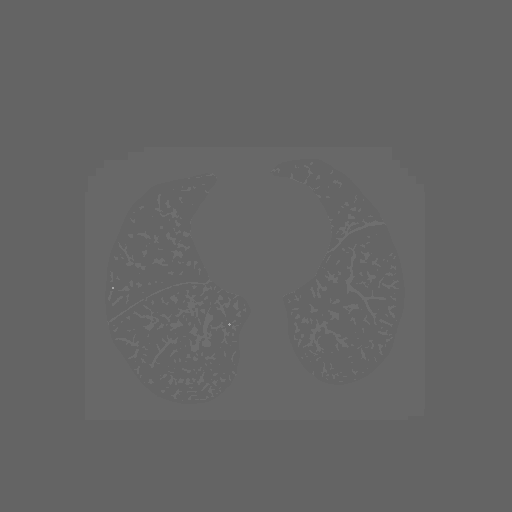
[frame 152/341  lung]
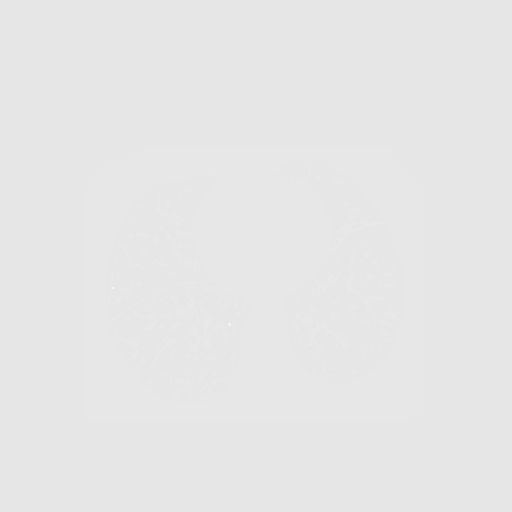
[frame 189/341  lung]
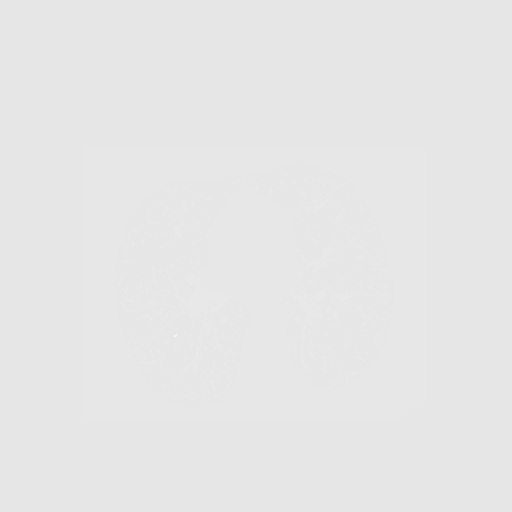
[frame 227/341  lung]
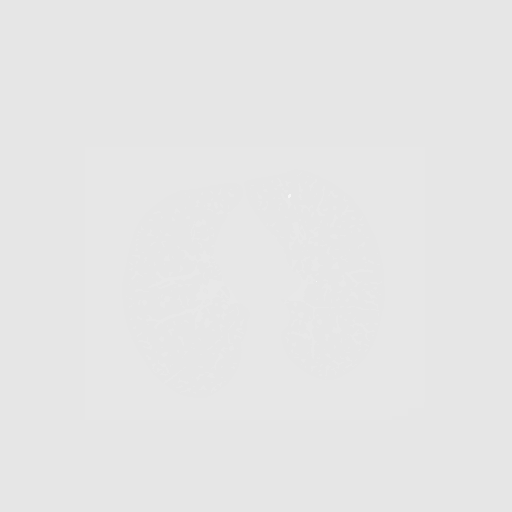
[frame 265/341  lung]
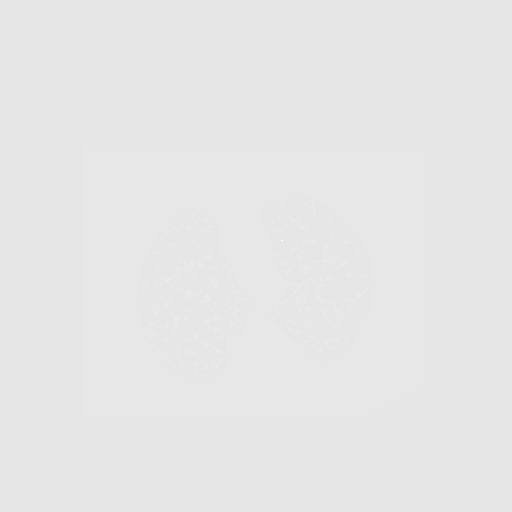
[frame 303/341  mediastinal]
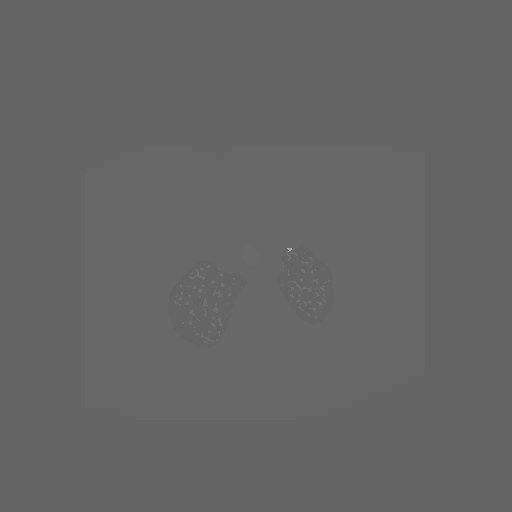
[frame 303/341  lung]
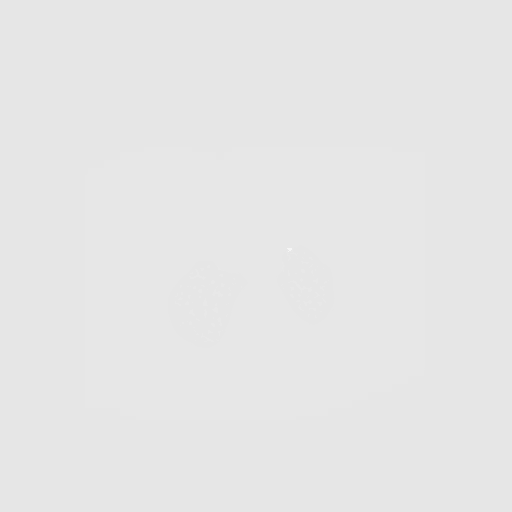
[frame 341/341  lung]
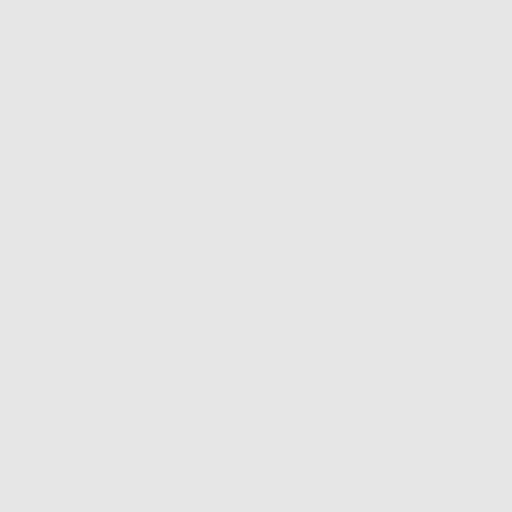

[10 of 10 positions shown; findings below may reference images not displayed]

FINDINGS: Cardiovascular: Normal heart size. No pericardial effusion. No
significant coronary artery calcifications. Atherosclerotic disease
of the thoracic aorta. Dilated main pulmonary artery, unchanged
compared to prior exam.

Mediastinum/Nodes: Esophagus and thyroid are unremarkable.
Previously described enlarged right hilar lymph nodes are no longer
present. No pathologically enlarged lymph nodes in the chest.

Lungs/Pleura: Central airways are patent. Mild centrilobular
emphysema. No consolidation, pleural effusion or pneumothorax.
Subsolid nodule in the left upper lobe is no longer present.

Upper Abdomen: Low-attenuation nodule of the left adrenal gland is
unchanged in size compared to prior exam and likely a benign
adenoma. No acute abnormality.

Musculoskeletal: No chest wall mass or suspicious bone lesions
identified.
IMPRESSION: 1. Sub solid nodule in the left upper lobe is no longer present,
compatible with resolved infectious or inflammatory process. No
suspicious pulmonary nodule seen on today's exam.
2. Aortic Atherosclerosis (MFFX2-FMH.H) and Emphysema (MFFX2-CAK.G).

## 2022-08-27 ENCOUNTER — Other Ambulatory Visit: Payer: Self-pay | Admitting: Nurse Practitioner

## 2022-08-27 DIAGNOSIS — J441 Chronic obstructive pulmonary disease with (acute) exacerbation: Secondary | ICD-10-CM

## 2022-08-27 DIAGNOSIS — J9611 Chronic respiratory failure with hypoxia: Secondary | ICD-10-CM

## 2022-08-27 NOTE — Telephone Encounter (Signed)
Apt scheduled 11/09/22

## 2022-08-27 NOTE — Telephone Encounter (Signed)
Medication Refill - Medication: albuterol (PROVENTIL) (2.5 MG/3ML) 0.083% nebulizer solution [[id number]]   Rx #: [id number]  albuterol (VENTOLIN HFA) 108 (90 Base) MCG/ACT inhaler [[id number]]   budesonide-formoterol (SYMBICORT) 160-4.5 MCG/ACT inhaler [[id number]]    Has the patient contacted their pharmacy? Yes.   (Agent: If no, request that the patient contact the pharmacy for the refill. If patient does not wish to contact the pharmacy document the reason why and proceed with request.) (Agent: If yes, when and what did the pharmacy advise?)  Preferred Pharmacy (with phone number or street name): [facility][id number] [address], [place] - [address] AT [facility] [address] [place] [address] Phone: [phone]  Fax: [phone]  Has the patient been seen for an appointment in the last year OR does the patient have an upcoming appointment? Yes.    Agent: Please be advised that RX refills may take up to 3 business days. We ask that you follow-up with your pharmacy.

## 2022-08-27 NOTE — Telephone Encounter (Signed)
Unable to refill per protocol, Rx requests are too soon.  Requested Prescriptions  Pending Prescriptions Disp Refills   albuterol (PROVENTIL) (2.5 MG/3ML) 0.083% nebulizer solution 150 mL 1    Sig: Take 3 mLs (2.5 mg total) by nebulization every 6 (six) hours as needed for wheezing or shortness of breath.     Pulmonology:  Beta Agonists 2 Failed - 08/27/2022  3:41 PM      Failed - Valid encounter within last 12 months    Recent Outpatient Visits           1 year ago Hospital discharge follow-up   [facility] Claiborne Rigg, NP   1 year ago Hospital discharge follow-up   [facility] Claiborne Rigg, NP   2 years ago COPD with acute exacerbation [facility])   [facility] Storm Frisk, MD   3 years ago Need for influenza vaccination   [facility] [place], Cornelius Moras, RPH-CPP   3 years ago Encounter for Papanicolaou smear for cervical cancer screening   [facility] Claiborne Rigg, NP       Future Appointments             In 2 months Claiborne Rigg, NP [facility]            Passed - Last BP in normal range    BP Readings from Last 1 Encounters:  05/14/21 129/75         Passed - Last Heart Rate in normal range    Pulse Readings from Last 1 Encounters:  05/14/21 87          budesonide-formoterol (SYMBICORT) 160-4.5 MCG/ACT inhaler 10.2 g 12    Sig: Inhale 2 puffs into the lungs 2 (two) times daily.     Pulmonology:  Combination Products Failed - 08/27/2022  3:41 PM      Failed - Valid encounter within last 12 months    Recent Outpatient Visits           1 year ago Hospital discharge follow-up   [facility] Claiborne Rigg, NP   1 year ago Hospital discharge follow-up   [facility]  Claiborne Rigg, NP   2 years ago COPD with acute exacerbation [facility])   [facility] Storm Frisk, MD   3 years ago Need for influenza vaccination   [facility] [place], Cornelius Moras, RPH-CPP   3 years ago Encounter for Papanicolaou smear for cervical cancer screening   [facility] Claiborne Rigg, NP       Future Appointments             In 2 months Claiborne Rigg, NP [facility]

## 2022-08-31 ENCOUNTER — Other Ambulatory Visit (HOSPITAL_COMMUNITY): Payer: Self-pay

## 2022-09-01 ENCOUNTER — Other Ambulatory Visit: Payer: Self-pay | Admitting: Nurse Practitioner

## 2022-09-01 DIAGNOSIS — J441 Chronic obstructive pulmonary disease with (acute) exacerbation: Secondary | ICD-10-CM

## 2022-09-01 DIAGNOSIS — J4489 Other specified chronic obstructive pulmonary disease: Secondary | ICD-10-CM

## 2022-09-01 DIAGNOSIS — J9611 Chronic respiratory failure with hypoxia: Secondary | ICD-10-CM

## 2022-09-01 MED ORDER — ALBUTEROL SULFATE (2.5 MG/3ML) 0.083% IN NEBU: 2.5000 mg | INHALATION_SOLUTION | Freq: Four times a day (QID) | RESPIRATORY_TRACT | 1 refills | Status: AC | PRN

## 2022-09-01 MED ORDER — ALBUTEROL SULFATE HFA 108 (90 BASE) MCG/ACT IN AERS: 2.0000 | INHALATION_SPRAY | Freq: Four times a day (QID) | RESPIRATORY_TRACT | 1 refills | Status: DC | PRN

## 2022-09-01 MED ORDER — BUDESONIDE-FORMOTEROL FUMARATE 160-4.5 MCG/ACT IN AERO: 2.0000 | INHALATION_SPRAY | Freq: Two times a day (BID) | RESPIRATORY_TRACT | 12 refills | Status: DC

## 2022-09-01 NOTE — Telephone Encounter (Signed)
Requested medication (s) are due for refill today - expired Rx  Requested medication (s) are on the active medication list -yes  Future visit scheduled -yes  Last refill: Albuterol- inhaler- 05/14/21- expired Rx, outside prescriber                                   Solution- 05/14/21-expired Rx                 Symbicort- 09/15/21 10.2g 12 RF- will expire soon- request 2 weeks 1 day early  Notes to clinic: expired Rx- (or expiring soon Rx) request sent for review  Requested Prescriptions  Pending Prescriptions Disp Refills   albuterol (PROVENTIL) (2.5 MG/3ML) 0.083% nebulizer solution 150 mL 1    Sig: Take 3 mLs (2.5 mg total) by nebulization every 6 (six) hours as needed for wheezing or shortness of breath.     Pulmonology:  Beta Agonists 2 Failed - 09/01/2022 11:32 AM      Failed - Valid encounter within last 12 months    Recent Outpatient Visits           1 year ago Hospital discharge follow-up   [facility] Claiborne Rigg, NP   1 year ago Hospital discharge follow-up   [facility] Claiborne Rigg, NP   2 years ago COPD with acute exacerbation [facility])   [facility] Storm Frisk, MD   3 years ago Need for influenza vaccination   [facility] [place], Cornelius Moras, RPH-CPP   3 years ago Encounter for Papanicolaou smear for cervical cancer screening   [place] [facility] [place], Shea Stakes, NP       Future Appointments             In 2 months Claiborne Rigg, NP [place] [facility]            Passed - Last BP in normal range    BP Readings from Last 1 Encounters:  05/14/21 129/75         Passed - Last Heart Rate in normal range    Pulse Readings from Last 1 Encounters:  05/14/21 87          albuterol (VENTOLIN HFA) 108 (90 Base) MCG/ACT inhaler 18 g 1    Sig: Inhale 2  puffs into the lungs every 6 (six) hours as needed for wheezing or shortness of breath.     Pulmonology:  Beta Agonists 2 Failed - 09/01/2022 11:32 AM      Failed - Valid encounter within last 12 months    Recent Outpatient Visits           1 year ago Hospital discharge follow-up   [facility] Claiborne Rigg, NP   1 year ago Hospital discharge follow-up   [place] Claiborne Rigg, NP   2 years ago COPD with acute exacerbation [facility])   [place] [facility] Storm Frisk, MD   3 years ago Need for influenza vaccination   [facility] [place], Cornelius Moras, RPH-CPP   3 years ago Encounter for Papanicolaou smear for cervical cancer screening   [facility] &  [facility] [place], Shea Stakes, NP       Future Appointments             In 2 months Claiborne Rigg, NP [facility]            Passed - Last BP in normal range    BP Readings from Last 1 Encounters:  05/14/21 129/75         Passed - Last Heart Rate in normal range    Pulse Readings from Last 1 Encounters:  05/14/21 87          budesonide-formoterol (SYMBICORT) 160-4.5 MCG/ACT inhaler 10.2 g 12    Sig: Inhale 2 puffs into the lungs 2 (two) times daily.     Pulmonology:  Combination Products Failed - 09/01/2022 11:32 AM      Failed - Valid encounter within last 12 months    Recent Outpatient Visits           1 year ago Hospital discharge follow-up   [facility] Claiborne Rigg, NP   1 year ago Hospital discharge follow-up   [facility] Rigg, NP   2 years ago COPD with acute exacerbation [facility])   [place] [facility] Storm Frisk, MD   3 years ago Need for influenza vaccination   [facility]  [facility] [place], Cornelius Moras, RPH-CPP   3 years ago Encounter for Papanicolaou smear for cervical cancer screening   [facility], Shea Stakes, NP       Future Appointments             In 2 months Claiborne Rigg, NP [facility]               Requested Prescriptions  Pending Prescriptions Disp Refills   albuterol (PROVENTIL) (2.5 MG/3ML) 0.083% nebulizer solution 150 mL 1    Sig: Take 3 mLs (2.5 mg total) by nebulization every 6 (six) hours as needed for wheezing or shortness of breath.     Pulmonology:  Beta Agonists 2 Failed - 09/01/2022 11:32 AM      Failed - Valid encounter within last 12 months    Recent Outpatient Visits           1 year ago Hospital discharge follow-up   [facility] Claiborne Rigg, NP   1 year ago Hospital discharge follow-up   [facility] Claiborne Rigg, NP   2 years ago COPD with acute exacerbation [facility])   [place] [facility] Storm Frisk, MD   3 years ago Need for influenza vaccination   [facility] [place], Cornelius Moras, RPH-CPP   3 years ago Encounter for Papanicolaou smear for cervical cancer screening   [place] [facility] Claiborne Rigg, NP       Future Appointments             In 2 months Claiborne Rigg, NP [place] [facility]            Passed - Last BP in normal range    BP Readings from Last 1 Encounters:  05/14/21 129/75  Passed - Last Heart Rate in normal range    Pulse Readings from Last 1 Encounters:  05/14/21 87          albuterol (VENTOLIN HFA) 108 (90 Base) MCG/ACT inhaler 18 g 1    Sig: Inhale 2 puffs into the lungs every 6 (six) hours as needed for wheezing or shortness of breath.     Pulmonology:  Beta Agonists 2 Failed -  09/01/2022 11:32 AM      Failed - Valid encounter within last 12 months    Recent Outpatient Visits           1 year ago Hospital discharge follow-up   [facility] Claiborne Rigg, NP   1 year ago Hospital discharge follow-up   [facility] Cla[facility] Rigg, NP   2 years ago COPD with acute exacerbation [facility])   [place] [facility] Storm Frisk, MD   3 years ago Need for influenza vaccination   [place] [facility] [place], Cornelius Moras, RPH-CPP   3 years ago Encounter for Papanicolaou smear for cervical cancer screening   [place] [facility] Claiborne Rigg, NP       Future Appointments             In 2 months Claiborne Rigg, NP [place] [facility]            Passed - Last BP in normal range    BP Readings from Last 1 Encounters:  05/14/21 129/75         Passed - Last Heart Rate in normal range    Pulse Readings from Last 1 Encounters:  05/14/21 87          budesonide-formoterol (SYMBICORT) 160-4.5 MCG/ACT inhaler 10.2 g 12    Sig: Inhale 2 puffs into the lungs 2 (two) times daily.     Pulmonology:  Combination Products Failed - 09/01/2022 11:32 AM      Failed - Valid encounter within last 12 months    Recent Outpatient Visits           1 year ago Hospital discharge follow-up   [facility] Claiborne Rigg, NP   1 year ago Hospital discharge follow-up   [facility] Claiborne Rigg, NP   2 years ago COPD with acute exacerbation [facility])   [place] [facility] Storm Frisk, MD   3 years ago Need for influenza vaccination   [facility] [place], Cornelius Moras, RPH-CPP   3 years ago Encounter for Papanicolaou smear for cervical cancer screening   [facility] Claiborne Rigg, NP       Future Appointments             In 2 months Claiborne Rigg, NP [employer] [facility]

## 2022-09-01 NOTE — Telephone Encounter (Signed)
Medication Refill - Medication: - Medication: albuterol (PROVENTIL) (2.5 MG/3ML) 0.083% nebulizer solution [[id number]]    Rx #: [id number]  albuterol (VENTOLIN HFA) 108 (90 Base) MCG/ACT inhaler [[id number]]    budesonide-formoterol (SYMBICORT) 160-4.5 MCG/ACT inhaler [[id number]]   Pt is calling to f/u on her medication refills. She stated that she is almost out of her medication and desperately needs it.  Has the patient contacted their pharmacy? No. (Agent: If no, request that the patient contact the pharmacy for the refill. If patient does not wish to contact the pharmacy document the reason why and proceed with request.)   Preferred Pharmacy (with phone number or street name):  [id number] [place] Sp[place], [place] - [address] AT [address]  [address] [place] [address]  Phone: [phone] Fax: [phone]  Hours: Not open 24 hours   Has the patient been seen for an appointment in the last year OR does the patient have an upcoming appointment? Yes.    Agent: Please be advised that RX refills may take up to 3 business days. We ask that you follow-up with your pharmacy.

## 2022-11-09 ENCOUNTER — Telehealth: Payer: Medicaid Other | Admitting: Nurse Practitioner

## 2022-11-09 ENCOUNTER — Encounter: Payer: Self-pay | Admitting: Nurse Practitioner

## 2022-11-09 DIAGNOSIS — J9611 Chronic respiratory failure with hypoxia: Secondary | ICD-10-CM | POA: Diagnosis not present

## 2022-11-09 DIAGNOSIS — R7303 Prediabetes: Secondary | ICD-10-CM | POA: Diagnosis not present

## 2022-11-09 DIAGNOSIS — E7800 Pure hypercholesterolemia, unspecified: Secondary | ICD-10-CM | POA: Diagnosis not present

## 2022-11-09 DIAGNOSIS — J4489 Other specified chronic obstructive pulmonary disease: Secondary | ICD-10-CM | POA: Diagnosis not present

## 2022-11-09 DIAGNOSIS — Z1231 Encounter for screening mammogram for malignant neoplasm of breast: Secondary | ICD-10-CM

## 2022-11-09 DIAGNOSIS — D72829 Elevated white blood cell count, unspecified: Secondary | ICD-10-CM

## 2022-11-09 MED ORDER — VENTOLIN HFA 108 (90 BASE) MCG/ACT IN AERS: 1.0000 | INHALATION_SPRAY | RESPIRATORY_TRACT | 1 refills | Status: DC | PRN

## 2022-11-09 MED ORDER — SYMBICORT 160-4.5 MCG/ACT IN AERO: 2.0000 | INHALATION_SPRAY | Freq: Two times a day (BID) | RESPIRATORY_TRACT | 1 refills | Status: DC

## 2022-11-09 NOTE — Progress Notes (Signed)
Virtual Visit Note  I discussed the limitations, risks, security and privacy concerns of performing an evaluation and management service by video and the availability of in person appointments. I also discussed with the patient that there may be a patient responsible charge related to this service. The patient expressed understanding and agreed to proceed.    I connected with Lessie Dings Weyandt on 11/09/22  at   2:10 PM EDT  EDT by VIDEO and verified that I am speaking with the correct person using two identifiers.   Location of Patient: Private Residence   Location of Provider: [facility]    Persons participating in VIRTUAL visit: Bertram Denver FNP-BC Lessie Dings Delgadillo    History of Present Illness: VIRTUAL visit for: COPD and medication refills  She has not been seen in this office in over a year. She needs an office visit. She has COPD on Home O2 2-3L. Although she has been referred to Pulmonology in the past she never- followed up. She states her O2 tank was given to her by the hospital a few years ago.  I am not sure who is supplying her tanks for her. I will refer her to pulmonology today. She will likely need a portable O2 concentrator tank. She has never had a nebulizer machine and needs one for home use. She has chronic shortness of breath with minimal exertion.    Past Medical History:  Diagnosis Date   COPD (chronic obstructive pulmonary disease) (HCC)    Edema of lower extremity 03/2018    Past Surgical History:  Procedure Laterality Date   TONSILLECTOMY      Family History  Problem Relation Age of Onset   Conductive hearing loss Mother    Diabetes Mother    Heart disease Mother        AMI/CHF   Hypertension Mother    Diabetes Father    Diabetes Sister    High blood pressure Sister    High blood pressure Sister    Diabetes Sister     Social History   Socioeconomic History   Marital status: Single    Spouse name: Not on file    Number of children: Not on file   Years of education: Not on file   Highest education level: Not on file  Occupational History    Employer: [employer]    Comment: Waitress  Tobacco Use   Smoking status: Former    Current packs/day: 1.00    Average packs/day: 1 pack/day for 46.6 years (46.6 ttl pk-yrs)    Types: Cigarettes    Start date: 1978   Smokeless tobacco: Never  Vaping Use   Vaping status: Never Used  Substance and Sexual Activity   Alcohol use: Yes    Comment: beer- weekly   Drug use: No   Sexual activity: Not Currently  Other Topics Concern   Not on file  Social History Narrative   Not on file   Social Determinants of Health   Financial Resource Strain: Not on file  Food Insecurity: Not on file  Transportation Needs: Not on file  Physical Activity: Not on file  Stress: Not on file  Social Connections: Not on file     Observations/Objective: Awake, alert and oriented x 3   Review of Systems  Constitutional:  Negative for fever, malaise/fatigue and weight loss.  HENT: Negative.  Negative for nosebleeds.   Eyes: Negative.  Negative for blurred vision, double vision and photophobia.  Respiratory:  Positive for shortness of breath (chronic). Negative for cough.   Cardiovascular: Negative.  Negative for chest pain, palpitations and leg swelling.  Gastrointestinal: Negative.  Negative for heartburn, nausea and vomiting.  Musculoskeletal: Negative.  Negative for myalgias.  Neurological: Negative.  Negative for dizziness, focal weakness, seizures and headaches.  Psychiatric/Behavioral: Negative.  Negative for suicidal ideas.     Assessment and Plan: Diagnoses and all orders for this visit:  Chronic respiratory failure with hypoxia (HCC) -     albuterol (VENTOLIN HFA) 108 (90 Base) MCG/ACT inhaler; Inhale 1-2 puffs into the lungs every 4 (four) hours as needed for wheezing or shortness of breath. -     budesonide-formoterol (SYMBICORT) 160-4.5 MCG/ACT inhaler;  Inhale 2 puffs into the lungs 2 (two) times daily. -     For home use only DME Nebulizer machine  COPD (chronic obstructive pulmonary disease) with chronic bronchitis -     albuterol (VENTOLIN HFA) 108 (90 Base) MCG/ACT inhaler; Inhale 1-2 puffs into the lungs every 4 (four) hours as needed for wheezing or shortness of breath. -     budesonide-formoterol (SYMBICORT) 160-4.5 MCG/ACT inhaler; Inhale 2 puffs into the lungs 2 (two) times daily. -     For home use only DME Nebulizer machine -     CBC with Differential; Future  Breast cancer screening by mammogram -     MM 3D SCREENING MAMMOGRAM BILATERAL BREAST; Future -     Fecal occult blood, imunochemical([facility]); Future  Prediabetes -     Hemoglobin A1c; Future -     CMP14+EGFR; Future  Hypercholesterolemia -     Lipid panel; Future  Leukocytosis, unspecified type -     CBC with Differential; Future     Follow Up Instructions Return in about 6 months (around 05/12/2023).     I discussed the assessment and treatment plan with the patient. The patient was provided an opportunity to ask questions and all were answered. The patient agreed with the plan and demonstrated an understanding of the instructions.   The patient was advised to call back or seek an in-person evaluation if the symptoms worsen or if the condition fails to improve as anticipated.  I provided 13 minutes of face-to-face time during this encounter including median intraservice time, reviewing previous notes, labs, imaging, medications and explaining diagnosis and management.  Claiborne Rigg, FNP-BC

## 2022-11-26 ENCOUNTER — Inpatient Hospital Stay: Admission: RE | Admit: 2022-11-26 | Payer: No Typology Code available for payment source | Source: Ambulatory Visit

## 2022-12-10 ENCOUNTER — Ambulatory Visit
Admission: RE | Admit: 2022-12-10 | Discharge: 2022-12-10 | Disposition: A | Discharge status: Home / Self Care | Payer: Medicaid Other | Source: Ambulatory Visit | Attending: Nurse Practitioner

## 2022-12-10 DIAGNOSIS — J9611 Chronic respiratory failure with hypoxia: Secondary | ICD-10-CM

## 2022-12-10 DIAGNOSIS — J4489 Other specified chronic obstructive pulmonary disease: Secondary | ICD-10-CM

## 2022-12-15 ENCOUNTER — Ambulatory Visit
Admission: RE | Admit: 2022-12-15 | Discharge: 2022-12-15 | Disposition: A | Discharge status: Home / Self Care | Payer: Medicaid Other | Source: Ambulatory Visit | Attending: Nurse Practitioner | Admitting: Nurse Practitioner

## 2022-12-15 DIAGNOSIS — Z1231 Encounter for screening mammogram for malignant neoplasm of breast: Secondary | ICD-10-CM

## 2022-12-23 ENCOUNTER — Other Ambulatory Visit: Payer: Self-pay | Admitting: Nurse Practitioner

## 2022-12-23 ENCOUNTER — Ambulatory Visit: Payer: Self-pay | Admitting: *Deleted

## 2022-12-23 DIAGNOSIS — R911 Solitary pulmonary nodule: Secondary | ICD-10-CM

## 2022-12-23 NOTE — Telephone Encounter (Signed)
  Reason for Disposition  [1] Caller requesting NON-URGENT health information AND [2] PCP's office is the best resource    CT scan chest result  Answer Assessment - Initial Assessment Questions 1. REASON FOR CALL or QUESTION: "What is your reason for calling today?" or "How can I best help you?" or "What question do you have that I can help answer?"     [name] with [facility] calling in to see if we got the CT scan chest lung for CA screening.  We did.   It's in Epic.   I will let Bertram Denver, NP be aware of the results.  Protocols used: Information Only Call - No Triage-A-AH

## 2022-12-23 NOTE — Telephone Encounter (Signed)
CT chest showing lung nodule. Likely benign but needs follow up CT in 6 months. [facility] will reach out to her to schedule closer to the time in march.

## 2022-12-23 NOTE — Telephone Encounter (Signed)
  Chief Complaint: [name] with [facility] called in to see if we got the CT chest lung CA screening results for Evelyn Denver, NP.   We did.   I sent a message letting Zelda know the results are in. Symptoms: N/A Frequency: N/A Pertinent Negatives: Patient denies N/A Disposition: [] ED /[] Urgent Care (no appt availability in office) / [] Appointment(In office/virtual)/ []  [facility]/ [] Home Care/ [] Refused Recommended Disposition /[] [facility]/ [x]  Follow-up with PCP Additional Notes: Message sent to Evelyn Denver, NP that Ct scan results are in for this pt.

## 2022-12-27 NOTE — Telephone Encounter (Signed)
Patient identified by name and date of birth.   Patient aware of results and voiced understanding.

## 2023-01-01 ENCOUNTER — Other Ambulatory Visit: Payer: Self-pay | Admitting: Nurse Practitioner

## 2023-01-01 DIAGNOSIS — R911 Solitary pulmonary nodule: Secondary | ICD-10-CM

## 2023-01-04 ENCOUNTER — Other Ambulatory Visit: Payer: Self-pay | Admitting: Nurse Practitioner

## 2023-01-04 DIAGNOSIS — J9611 Chronic respiratory failure with hypoxia: Secondary | ICD-10-CM

## 2023-01-04 DIAGNOSIS — J4489 Other specified chronic obstructive pulmonary disease: Secondary | ICD-10-CM

## 2023-01-04 NOTE — Telephone Encounter (Signed)
Requested Prescriptions  Pending Prescriptions Disp Refills   VENTOLIN HFA 108 (90 Base) MCG/ACT inhaler [Pharmacy Med Name: VENTOLIN HFA INH W/DOS CTR [id number]] 18 g 1    Sig: INHALE 1 TO 2 PUFFS INTO THE LUNGS EVERY 4 HOURS AS NEEDED FOR WHEEZING OR SHORTNESS OF BREATH     Pulmonology:  Beta Agonists 2 Passed - 01/04/2023  9:20 AM      Passed - Last BP in normal range    BP Readings from Last 1 Encounters:  05/14/21 129/75         Passed - Last Heart Rate in normal range    Pulse Readings from Last 1 Encounters:  05/14/21 87         Passed - Valid encounter within last 12 months    Recent Outpatient Visits           1 month ago Chronic respiratory failure with hypoxia [facility])   [place] [facility] [place], Shea Stakes, NP   1 year ago Hospital discharge follow-up   [facility] Claiborne Rigg, NP   1 year ago Hospital discharge follow-up   [facility] Claiborne Rigg, NP   3 years ago COPD with acute exacerbation [facility])   [place] [facility] Storm Frisk, MD   4 years ago Need for influenza vaccination   [facility] [place], Cornelius Moras, RPH-CPP

## 2023-02-08 ENCOUNTER — Encounter: Payer: Self-pay | Admitting: Pulmonary Disease

## 2023-02-08 ENCOUNTER — Ambulatory Visit (INDEPENDENT_AMBULATORY_CARE_PROVIDER_SITE_OTHER): Payer: Medicaid Other | Admitting: Pulmonary Disease

## 2023-02-08 VITALS — BP 151/82 | HR 68 | Temp 97.6°F | Ht 68.0 in | Wt 272.4 lb

## 2023-02-08 DIAGNOSIS — R0609 Other forms of dyspnea: Secondary | ICD-10-CM | POA: Diagnosis not present

## 2023-02-08 DIAGNOSIS — J9611 Chronic respiratory failure with hypoxia: Secondary | ICD-10-CM

## 2023-02-08 MED ORDER — INCRUSE ELLIPTA 62.5 MCG/ACT IN AEPB
1.0000 | INHALATION_SPRAY | Freq: Every day | RESPIRATORY_TRACT | 11 refills | Status: DC
Start: 2023-02-08 — End: 2023-12-01

## 2023-02-08 MED ORDER — BUDESONIDE-FORMOTEROL FUMARATE 160-4.5 MCG/ACT IN AERO: 2.0000 | INHALATION_SPRAY | Freq: Two times a day (BID) | RESPIRATORY_TRACT | 11 refills | Status: DC

## 2023-02-08 MED ORDER — PREDNISONE 20 MG PO TABS
40.0000 mg | ORAL_TABLET | Freq: Every day | ORAL | 0 refills | Status: AC
Start: 2023-02-08 — End: 2023-02-13

## 2023-02-08 MED ORDER — ALBUTEROL SULFATE HFA 108 (90 BASE) MCG/ACT IN AERS: 2.0000 | INHALATION_SPRAY | RESPIRATORY_TRACT | 11 refills | Status: DC | PRN

## 2023-02-08 NOTE — Progress Notes (Signed)
@Patient  ID: Evelyn Peterson, female    DOB: [birth year], 62 y.o.   MRN: [id number]  Chief Complaint  Patient presents with   Consult    Dx COPD in 2019 and recurrent pneumonias.  Needs refills on Symbicort and albuterol inhalers.    Referring provider: Claiborne Rigg, NP  HPI:   62 y.o. woman whom we are seeing for evaluation of chronic hypoxemic respiratory failure as well as dyspnea on exertion.  Most recent PCP note x 2 reviewed.  Patient was been on oxygen for some time.  Many years.  Nasal started at night.  And gradually increased over time.  Now using 2 L with exertion and at rest as well as at night.  She has baseline dyspnea.  Worse on inclines or stairs.  No time of day when things are better or worse.  No positional things better or worse.  No seasonal environmental factors she can the pandemic things better or worse.  She used albuterol as needed with some relief.  She is been on Symbicort for sometimes and thinks it helps.  In the past he was on Incruse and felt like this also improved her breathing.  She is no longer on Incruse due to insurance issues.  Now with Medicaid.  Most recent call sectional imaging CT lung cancer screening 11/2022 personally reviewed, emphysematous changes noted, 5 Miller right upper lobe nodule.  Recommended interval CT scan.  I can see this is been ordered in the EMR.  She says that she knows it needs to be done in mid March, 3 months from 11/2022.  She had an echocardiogram in 2020.  This demonstrated normal EF, right atrium dilated, no significant TVR, right ventricle was moderately dilated with normal function.  Questionaires / Pulmonary Flowsheets:   ACT:      No data to display          MMRC:     No data to display          Epworth:      No data to display          Tests:   FENO:  No results found for: "NITRICOXIDE"  PFT:     No data to display          WALK:      No data to display           Imaging: Personally reviewed and as per EMR and discussion in this note No results found.  Lab Results: Personally reviewed CBC    Component Value Date/Time   WBC 11.7 (H) 05/14/2021 0201   RBC 4.32 05/14/2021 0201   HGB 14.1 05/14/2021 0201   HGB 15.3 01/07/2021 1438   HCT 44.2 05/14/2021 0201   HCT 47.6 (H) 01/07/2021 1438   PLT 234 05/14/2021 0201   PLT 332 01/07/2021 1438   MCV 102.3 (H) 05/14/2021 0201   MCV 96 01/07/2021 1438   MCH 32.6 05/14/2021 0201   MCHC 31.9 05/14/2021 0201   RDW 12.4 05/14/2021 0201   RDW 11.7 01/07/2021 1438   LYMPHSABS 1.9 05/14/2021 0201   LYMPHSABS 3.0 01/07/2021 1438   MONOABS 0.6 05/14/2021 0201   EOSABS 0.0 05/14/2021 0201   EOSABS 0.2 01/07/2021 1438   BASOSABS 0.0 05/14/2021 0201   BASOSABS 0.0 01/07/2021 1438    BMET    Component Value Date/Time   NA 141 05/13/2021 0136   NA 145 (H) 01/07/2021 1438   K 4.2 05/13/2021 0136  CL 98 05/13/2021 0136   CO2 36 (H) 05/13/2021 0136   GLUCOSE 178 (H) 05/13/2021 0136   BUN 14 05/13/2021 0136   BUN 13 01/07/2021 1438   CREATININE 0.69 05/13/2021 0136   CALCIUM 9.2 05/13/2021 0136   GFRNONAA >60 05/13/2021 0136   GFRAA 110 12/26/2018 1105    BNP    Component Value Date/Time   BNP 18.0 05/12/2021 0610    ProBNP No results found for: "PROBNP"  Specialty Problems       Pulmonary Problems   CAP (community acquired pneumonia)   Respiratory failure, acute-on-chronic (HCC)    No Known Allergies  Immunization History  Administered Date(s) Administered   Influenza,inj,Quad PF,6+ Mos 04/01/2018, 12/26/2018, 03/04/2020, 01/07/2021   PFIZER(Purple Top)SARS-COV-2 Vaccination 06/24/2019, 07/15/2019   PNEUMOCOCCAL CONJUGATE-20 01/07/2021   Pneumococcal Polysaccharide-23 04/01/2018   Tdap 05/09/2018    Past Medical History:  Diagnosis Date   COPD (chronic obstructive pulmonary disease) (HCC)    Edema of lower extremity 03/2018    Tobacco History: Social History    Tobacco Use  Smoking Status Former   Current packs/day: 1.00   Average packs/day: 1 pack/day for 46.9 years (46.9 ttl pk-yrs)   Types: Cigarettes   Start date: 1978  Smokeless Tobacco Never   Counseling given: Not Answered   Continue to not smoke  Outpatient Encounter Medications as of 64/02/2023  Medication Sig   albuterol (PROVENTIL) (2.5 MG/3ML) 0.083% nebulizer solution Take 3 mLs (2.5 mg total) by nebulization every 6 (six) hours as needed for wheezing or shortness of breath.   MUCINEX MAXIMUM STRENGTH 1200 MG TB12 Take 1,200 mg by mouth 2 (two) times daily as needed (for coughing or to loosen phlegm).   umeclidinium bromide (INCRUSE ELLIPTA) 62.5 MCG/ACT AEPB Inhale 1 puff into the lungs daily.   [DISCONTINUED] budesonide-formoterol (SYMBICORT) 160-4.5 MCG/ACT inhaler Inhale 2 puffs into the lungs 2 (two) times daily.   [DISCONTINUED] VENTOLIN HFA 108 (90 Base) MCG/ACT inhaler INHALE 1 TO 2 PUFFS INTO THE LUNGS EVERY 4 HOURS AS NEEDED FOR WHEEZING OR SHORTNESS OF BREATH   albuterol (VENTOLIN HFA) 108 (90 Base) MCG/ACT inhaler Inhale 2 puffs into the lungs every 4 (four) hours as needed for wheezing or shortness of breath.   budesonide-formoterol (SYMBICORT) 160-4.5 MCG/ACT inhaler Inhale 2 puffs into the lungs 2 (two) times daily.   No facility-administered encounter medications on file as of 02/08/2023.     Review of Systems  Review of Systems  No chest pain with exertion.  No orthopnea or PND.  Comprehensive review of systems otherwise negative. Physical Exam  BP (!) 151/82 (BP Location: Left Arm, Patient Position: Sitting, Cuff Size: Large)   Pulse 68   Temp 97.6 F (36.4 C) (Oral)   Ht 5\' 8"  (1.727 m)   Wt 272 lb 6.4 oz (123.6 kg)   SpO2 93% Comment: 2 L POC pulsed oxygen  BMI 41.42 kg/m   Wt Readings from Last 5 Encounters:  02/08/23 272 lb 6.4 oz (123.6 kg)  01/07/21 255 lb 8 oz (115.9 kg)  10/28/20 255 lb (115.7 kg)  09/04/19 245 lb 6.4 oz (111.3 kg)   12/26/18 242 lb (109.8 kg)    BMI Readings from Last 5 Encounters:  02/08/23 41.42 kg/m  01/07/21 38.85 kg/m  10/28/20 38.77 kg/m  09/04/19 38.44 kg/m  12/26/18 37.90 kg/m     Physical Exam General: Sitting in exam chair, no acute distress Eyes: EOMI, no icterus Neck: Supple, no JVP Pulmonary: Distant, diffuse end expiratory wheezing  throughout, normal work of breathing on nasal cannula 2 L via pulsed device Cardiovascular: Warm, no edema Abdomen: Nondistended, bowel  MSK: No synovitis, no joint effusion Neuro: Normal gait, no weakness Psych: Normal mood, full affect   Assessment & Plan:   Chronic hypoxemic respiratory failure: Presumably related to emphysema.  Possible component of OHS.  To continue 2 L nasal cannula.  At all times.  Even at night.  Emphysema: Related to smoking.  Encouraged to discontinue all cigarettes, has cut back but occasionally still smokes.  Dyspnea on exertion: Largely driven by emphysema.  Possible contribution of RV dysfunction noted in 2020, at risk for pulmonary hypertension given her hypoxemia.  Continue Symbicort high-dose.  Add Incruse for maximal inhaler therapy.  Previously improved on LAMA therapy in the past.  Albuterol as needed.  All 3 prescribed today.  PFTs for further evaluation.  If COPD present consider addition of Ohtuvayre.   Wheezing: Suspect related to underlying smoking-related lung disease but possible asthma.  Consider phenotyping in the future if continues to decompensate.  Prednisone 40 mg a for 5 days prescribed today.   Return in about 3 months (around 05/11/2023) for f/u Dr. Judeth Horn, after PFT.   Karren Burly, MD 02/08/2023   This appointment required 62 minutes of patient care (this includes precharting, chart review, review of results, face-to-face care, etc.).

## 2023-02-08 NOTE — Patient Instructions (Signed)
Nice to meet you  Continue Symbicort 2 puffs twice a day rinse your mouth out with water after every use  I refilled Symbicort and albuterol  Use Incruse 1 puff once a day, new prescription today  We will get pulmonary function test when you return next visit to see if COPD is present or not, this will help [place] know the best medicines to use in the future  Return to clinic in 3 months or sooner as needed with Dr. Judeth Horn

## 2023-05-11 ENCOUNTER — Ambulatory Visit: Payer: Medicaid Other | Admitting: Pulmonary Disease

## 2023-06-22 ENCOUNTER — Ambulatory Visit
Admission: RE | Admit: 2023-06-22 | Discharge: 2023-06-22 | Disposition: A | Discharge status: Home / Self Care | Payer: Medicaid Other | Source: Ambulatory Visit | Attending: Nurse Practitioner

## 2023-06-22 DIAGNOSIS — R911 Solitary pulmonary nodule: Secondary | ICD-10-CM

## 2023-07-06 ENCOUNTER — Ambulatory Visit: Payer: Medicaid Other | Admitting: Pulmonary Disease

## 2023-07-06 ENCOUNTER — Telehealth: Payer: Self-pay | Admitting: Pulmonary Disease

## 2023-07-06 NOTE — Telephone Encounter (Signed)
 Patient would like to know the results of her CT once they become available. [phone]

## 2023-07-06 NOTE — Telephone Encounter (Signed)
 Lm for patient.  This CT was ordered by Dr. Meredeth Ide.

## 2023-07-08 NOTE — Telephone Encounter (Signed)
 Pt is aware of below message and voiced her understanding. She will contact PCP. Nothing further needed.

## 2023-07-25 ENCOUNTER — Telehealth: Payer: Self-pay | Admitting: Nurse Practitioner

## 2023-07-25 ENCOUNTER — Encounter: Payer: Self-pay | Admitting: Nurse Practitioner

## 2023-07-25 ENCOUNTER — Other Ambulatory Visit: Payer: Self-pay | Admitting: Nurse Practitioner

## 2023-07-25 DIAGNOSIS — R911 Solitary pulmonary nodule: Secondary | ICD-10-CM

## 2023-07-25 NOTE — Telephone Encounter (Signed)
 Copied from CRM [id number]. Topic: Clinical - Lab/Test Results  >> Jul 25, 2023  8:38 AM Howard Macho wrote: Reason for CRM: tiffany from [facility] radiology called wanting to make sure the cma have the patient CT test results [phone]

## 2023-07-26 NOTE — Telephone Encounter (Signed)
 Confirmed with Ms. Evelyn Peterson.

## 2023-08-03 ENCOUNTER — Telehealth: Payer: Self-pay

## 2023-08-03 ENCOUNTER — Other Ambulatory Visit: Payer: Self-pay | Admitting: Nurse Practitioner

## 2023-08-03 DIAGNOSIS — R911 Solitary pulmonary nodule: Secondary | ICD-10-CM

## 2023-08-03 NOTE — Telephone Encounter (Signed)
 Duplicated

## 2023-08-03 NOTE — Telephone Encounter (Signed)
 Copied from CRM [id number]. Topic: General - Other >> Aug 03, 2023 12:44 PM Zipporah Him wrote: Reason for CRM: [facility] calling stating they do not have a pre authorization on file for this patients PET scan that is to be completed tomorrow 5/8. Please call if any issues [phone] ex. [phone]. Stated if they can get an approval within the next hour or so they will not have to cancel this patients appointment.

## 2023-08-04 ENCOUNTER — Encounter (HOSPITAL_COMMUNITY)

## 2023-08-15 ENCOUNTER — Encounter: Payer: Self-pay | Admitting: Nurse Practitioner

## 2023-08-18 ENCOUNTER — Inpatient Hospital Stay: Admission: RE | Admit: 2023-08-18 | Source: Ambulatory Visit

## 2023-08-31 ENCOUNTER — Encounter: Payer: Self-pay | Admitting: Physician Assistant

## 2023-08-31 NOTE — Progress Notes (Signed)
 Entered in error, contacted by someone about a PET scan, but this ended up not being our patient. Did not read any chart notes

## 2023-09-29 ENCOUNTER — Encounter

## 2023-09-29 ENCOUNTER — Ambulatory Visit: Admitting: Pulmonary Disease

## 2023-11-02 ENCOUNTER — Other Ambulatory Visit

## 2023-11-23 ENCOUNTER — Inpatient Hospital Stay
Admission: RE | Admit: 2023-11-23 | Discharge: 2023-11-23 | Disposition: A | Discharge status: Home / Self Care | Source: Ambulatory Visit | Attending: Nurse Practitioner

## 2023-11-23 DIAGNOSIS — R911 Solitary pulmonary nodule: Secondary | ICD-10-CM

## 2023-11-30 ENCOUNTER — Ambulatory Visit: Admitting: Pulmonary Disease

## 2023-11-30 DIAGNOSIS — R0609 Other forms of dyspnea: Secondary | ICD-10-CM

## 2023-11-30 LAB — PULMONARY FUNCTION TEST
DL/VA % pred: 92 %
DL/VA: 3.82 ml/min/mmHg/L
DLCO cor % pred: 64 %
DLCO cor: 13.25 ml/min/mmHg
DLCO unc % pred: 64 %
DLCO unc: 13.25 ml/min/mmHg
FEF 25-75 Post: 0.56 L/s
FEF 25-75 Pre: 0.37 L/s
FEF2575-%Change-Post: 52 %
FEF2575-%Pred-Post: 25 %
FEF2575-%Pred-Pre: 16 %
FEV1-%Change-Post: 15 %
FEV1-%Pred-Post: 39 %
FEV1-%Pred-Pre: 33 %
FEV1-Post: 0.99 L
FEV1-Pre: 0.86 L
FEV1FVC-%Change-Post: -8 %
FEV1FVC-%Pred-Pre: 66 %
FEV6-%Change-Post: 17 %
FEV6-%Pred-Post: 61 %
FEV6-%Pred-Pre: 52 %
FEV6-Post: 1.97 L
FEV6-Pre: 1.68 L
FEV6FVC-%Change-Post: -7 %
FEV6FVC-%Pred-Post: 96 %
FEV6FVC-%Pred-Pre: 103 %
FVC-%Change-Post: 26 %
FVC-%Pred-Post: 63 %
FVC-%Pred-Pre: 50 %
FVC-Post: 2.12 L
FVC-Pre: 1.68 L
Post FEV1/FVC ratio: 47 %
Post FEV6/FVC ratio: 93 %
Pre FEV1/FVC ratio: 51 %
Pre FEV6/FVC Ratio: 100 %
RV % pred: 187 %
RV: 3.97 L
TLC % pred: 114 %
TLC: 5.97 L

## 2023-11-30 NOTE — Patient Instructions (Signed)
 Full PFT performed today.

## 2023-11-30 NOTE — Progress Notes (Signed)
 Full PFT performed today.

## 2023-12-01 ENCOUNTER — Encounter

## 2023-12-01 ENCOUNTER — Telehealth (INDEPENDENT_AMBULATORY_CARE_PROVIDER_SITE_OTHER): Admitting: Pulmonary Disease

## 2023-12-01 ENCOUNTER — Ambulatory Visit: Admitting: Pulmonary Disease

## 2023-12-01 ENCOUNTER — Encounter: Payer: Self-pay | Admitting: Pulmonary Disease

## 2023-12-01 VITALS — Ht 68.0 in | Wt 282.0 lb

## 2023-12-01 DIAGNOSIS — J189 Pneumonia, unspecified organism: Secondary | ICD-10-CM

## 2023-12-01 DIAGNOSIS — J9611 Chronic respiratory failure with hypoxia: Secondary | ICD-10-CM

## 2023-12-01 DIAGNOSIS — Z87891 Personal history of nicotine dependence: Secondary | ICD-10-CM

## 2023-12-01 DIAGNOSIS — J439 Emphysema, unspecified: Secondary | ICD-10-CM

## 2023-12-01 DIAGNOSIS — J441 Chronic obstructive pulmonary disease with (acute) exacerbation: Secondary | ICD-10-CM

## 2023-12-01 DIAGNOSIS — J9621 Acute and chronic respiratory failure with hypoxia: Secondary | ICD-10-CM

## 2023-12-01 MED ORDER — AZITHROMYCIN 250 MG PO TABS: ORAL_TABLET | ORAL | 0 refills | Status: AC

## 2023-12-01 MED ORDER — INCRUSE ELLIPTA 62.5 MCG/ACT IN AEPB: 1.0000 | INHALATION_SPRAY | Freq: Every day | RESPIRATORY_TRACT | 11 refills | Status: AC

## 2023-12-01 MED ORDER — BUDESONIDE-FORMOTEROL FUMARATE 160-4.5 MCG/ACT IN AERO: 2.0000 | INHALATION_SPRAY | Freq: Two times a day (BID) | RESPIRATORY_TRACT | 11 refills | Status: AC

## 2023-12-01 MED ORDER — ALBUTEROL SULFATE HFA 108 (90 BASE) MCG/ACT IN AERS: 2.0000 | INHALATION_SPRAY | RESPIRATORY_TRACT | 11 refills | Status: AC | PRN

## 2023-12-01 MED ORDER — PREDNISONE 20 MG PO TABS: ORAL_TABLET | ORAL | 0 refills | Status: AC

## 2023-12-01 NOTE — Patient Instructions (Signed)
 Nice to see you again  Symbicort  and Incruse and albuterol  inhaler refilled, continue as prescribed  Prednisone  taper and Z-Pak today with increased cough  Please let me know when the CT scan results and if anything different needs to be done  Return to clinic in 6 months or sooner as needed with Dr. Annella

## 2023-12-01 NOTE — Progress Notes (Signed)
 @Patient  ID: Evelyn Peterson, female    DOB: [birth year], 63 y.o.   MRN: [id number]  Chief Complaint  Patient presents with   Medical Management of Chronic Issues    Congestion has a cold wants prednisone      Referring provider: Theotis Haze ORN, NP  HPI:   62 y.o. woman whom we are seeing for evaluation of chronic hypoxemic respiratory failure as well as dyspnea on exertion.  Most recent PCP note x 2 reviewed.  For routine follow-up.  Overall doing relatively well.  Unfortunately increased cough and shortness of breath last day or so.  Grandchildren with colds and sniffles.  After returning to school.  We discussed treatment for early COPD exacerbation with increased sputum and cough and shortness of breath.  In the meantime PFTs obtained, this reveals severe COPD hyperinflation and air trapping positive bronchodilator sponsor.  Overall on good medicines for now.  Infrequent exacerbations.  We discussed biologic therapies in the future if needed.  She had a repeat CT scan with right upper lobe nodule.  This looks grossly stable on my review of films, read pending.  Discussed with the weight radiology read and recommendations based on size etc.  HPI initial visit: Patient was been on oxygen for some time.  Many years.  Nasal started at night.  And gradually increased over time.  Now using 2 L with exertion and at rest as well as at night.  She has baseline dyspnea.  Worse on inclines or stairs.  No time of day when things are better or worse.  No positional things better or worse.  No seasonal environmental factors she can the pandemic things better or worse.  She used albuterol  as needed with some relief.  She is been on Symbicort  for sometimes and thinks it helps.  In the past he was on Incruse and felt like this also improved her breathing.  She is no longer on Incruse due to insurance issues.  Now with Medicaid.  Most recent call sectional imaging CT lung cancer screening 11/2022  personally reviewed, emphysematous changes noted, 5 Miller right upper lobe nodule.  Recommended interval CT scan.  I can see this is been ordered in the EMR.  She says that she knows it needs to be done in mid March, 3 months from 11/2022.  She had an echocardiogram in 2020.  This demonstrated normal EF, right atrium dilated, no significant TVR, right ventricle was moderately dilated with normal function.  Questionaires / Pulmonary Flowsheets:   ACT:      No data to display          MMRC:     No data to display          Epworth:      No data to display          Tests:   FENO:  No results found for: NITRICOXIDE  PFT:    Latest Ref Rng & Units 11/30/2023   12:30 PM  PFT Results  FVC-Pre L 1.68  P  FVC-Predicted Pre % 50  P  FVC-Post L 2.12  P  FVC-Predicted Post % 63  P  Pre FEV1/FVC % % 51  P  Post FEV1/FCV % % 47  P  FEV1-Pre L 0.86  P  FEV1-Predicted Pre % 33  P  FEV1-Post L 0.99  P  DLCO uncorrected ml/min/mmHg 13.25  P  DLCO UNC% % 64  P  DLCO corrected ml/min/mmHg 13.25  P  DLCO  COR %Predicted % 64  P  DLVA Predicted % 92  P  TLC L 5.97  P  TLC % Predicted % 114  P  RV % Predicted % 187  P    P Preliminary result  Personally reviewed interpreted as severe fixed obstruction, significant bronchodilator sponsor in the [facility] with albuterol , lung wires consistent with hyperinflation and air trapping, DLCO is moderately reduced  WALK:      No data to display          Imaging: Personally reviewed and as per EMR and discussion in this note No results found.  Lab Results: Personally reviewed CBC    Component Value Date/Time   WBC 11.7 (H) 05/14/2021 0201   RBC 4.32 05/14/2021 0201   HGB 14.1 05/14/2021 0201   HGB 15.3 01/07/2021 1438   HCT 44.2 05/14/2021 0201   HCT 47.6 (H) 01/07/2021 1438   PLT 234 05/14/2021 0201   PLT 332 01/07/2021 1438   MCV 102.3 (H) 05/14/2021 0201   MCV 96 01/07/2021 1438   MCH 32.6 05/14/2021 0201   MCHC 31.9  05/14/2021 0201   RDW 12.4 05/14/2021 0201   RDW 11.7 01/07/2021 1438   LYMPHSABS 1.9 05/14/2021 0201   LYMPHSABS 3.0 01/07/2021 1438   MONOABS 0.6 05/14/2021 0201   EOSABS 0.0 05/14/2021 0201   EOSABS 0.2 01/07/2021 1438   BASOSABS 0.0 05/14/2021 0201   BASOSABS 0.0 01/07/2021 1438    BMET    Component Value Date/Time   NA 141 05/13/2021 0136   NA 145 (H) 01/07/2021 1438   K 4.2 05/13/2021 0136   CL 98 05/13/2021 0136   CO2 36 (H) 05/13/2021 0136   GLUCOSE 178 (H) 05/13/2021 0136   BUN 14 05/13/2021 0136   BUN 13 01/07/2021 1438   CREATININE 0.69 05/13/2021 0136   CALCIUM  9.2 05/13/2021 0136   GFRNONAA >60 05/13/2021 0136   GFRAA 110 12/26/2018 1105    BNP    Component Value Date/Time   BNP 18.0 05/12/2021 0610    ProBNP No results found for: PROBNP  Specialty Problems       Pulmonary Problems   CAP (community acquired pneumonia)   Respiratory failure, acute-on-chronic (HCC)    No Known Allergies  Immunization History  Administered Date(s) Administered   Influenza,inj,Quad PF,6+ Mos 04/01/2018, 12/26/2018, 03/04/2020, 01/07/2021   PFIZER(Purple Top)SARS-COV-2 Vaccination 06/24/2019, 07/15/2019   PNEUMOCOCCAL CONJUGATE-20 01/07/2021   Pneumococcal Polysaccharide-23 04/01/2018   Tdap 05/09/2018    Past Medical History:  Diagnosis Date   COPD (chronic obstructive pulmonary disease) (HCC)    Edema of lower extremity 03/2018    Tobacco History: Social History   Tobacco Use  Smoking Status Former   Current packs/day: 1.00   Average packs/day: 1 pack/day for 47.7 years (47.7 ttl pk-yrs)   Types: Cigarettes   Start date: 1978  Smokeless Tobacco Never   Counseling given: Not Answered   Continue to not smoke  Outpatient Encounter Medications as of 12/01/2023  Medication Sig   albuterol  (PROVENTIL ) (2.5 MG/3ML) 0.083% nebulizer solution Take 3 mLs (2.5 mg total) by nebulization every 6 (six) hours as needed for wheezing or shortness of breath.    azithromycin  (ZITHROMAX ) 250 MG tablet Take 2 tablets (500 mg total) by mouth daily for 1 day, THEN 1 tablet (250 mg total) daily for 4 days.   MUCINEX  MAXIMUM STRENGTH 1200 MG TB12 Take 1,200 mg by mouth 2 (two) times daily as needed (for coughing or to loosen phlegm).  predniSONE  (DELTASONE ) 20 MG tablet Take 2 tablets (40 mg total) by mouth daily with breakfast for 5 days, THEN 1 tablet (20 mg total) daily with breakfast for 5 days.   [DISCONTINUED] albuterol  (VENTOLIN  HFA) 108 (90 Base) MCG/ACT inhaler Inhale 2 puffs into the lungs every 4 (four) hours as needed for wheezing or shortness of breath.   [DISCONTINUED] budesonide -formoterol  (SYMBICORT ) 160-4.5 MCG/ACT inhaler Inhale 2 puffs into the lungs 2 (two) times daily.   [DISCONTINUED] umeclidinium bromide  (INCRUSE ELLIPTA ) 62.5 MCG/ACT AEPB Inhale 1 puff into the lungs daily.   albuterol  (VENTOLIN  HFA) 108 (90 Base) MCG/ACT inhaler Inhale 2 puffs into the lungs every 4 (four) hours as needed for wheezing or shortness of breath.   budesonide -formoterol  (SYMBICORT ) 160-4.5 MCG/ACT inhaler Inhale 2 puffs into the lungs 2 (two) times daily.   umeclidinium bromide  (INCRUSE ELLIPTA ) 62.5 MCG/ACT AEPB Inhale 1 puff into the lungs daily.   No facility-administered encounter medications on file as of 12/01/2023.     Review of Systems  Review of Systems  N/a Physical Exam  Ht 5' 8 (1.727 m)   Wt 282 lb (127.9 kg)   SpO2 94% Comment: 2L  BMI 42.88 kg/m   Wt Readings from Last 5 Encounters:  12/01/23 282 lb (127.9 kg)  02/08/23 272 lb 6.4 oz (123.6 kg)  01/07/21 255 lb 8 oz (115.9 kg)  10/28/20 255 lb (115.7 kg)  09/04/19 245 lb 6.4 oz (111.3 kg)    BMI Readings from Last 5 Encounters:  12/01/23 42.88 kg/m  02/08/23 41.42 kg/m  01/07/21 38.85 kg/m  10/28/20 38.77 kg/m  09/04/19 38.44 kg/m     Physical Exam General: Sitting in exam chair, no acute distress Eyes: EOMI, no icterus Neck: Supple, no JVP Pulmonary: Distant,  diffuse end expiratory wheezing throughout, normal work of breathing on nasal cannula 2 L via pulsed device Cardiovascular: Warm, no edema Abdomen: Nondistended, bowel  MSK: No synovitis, no joint effusion Neuro: Normal gait, no weakness Psych: Normal mood, full affect   Assessment & Plan:   Chronic hypoxemic respiratory failure: Presumably related to emphysema.  Possible component of OHS.  To continue 2 L nasal cannula.  At all times.  Even at night.  Emphysema/severe COPD with asthma overlap: Takes obstruction and significant bronchodilator response 11/2023.  Related to smoking.  Encouraged to discontinue all cigarettes.  Continue triple inhaled therapy with Incruse and Symbicort , refilled today.  Continue albuterol  as needed.  Refilled today.  Continue ohtuvayre , Biologics in the future.  COPD with acute exacerbation: Increased cough sputum shortness of breath over last day or so.  Recent viral exposure.  Prednisone  taper, Z-Pak sent today.   Return in about 6 months (around 05/30/2024) for f/u Dr. Annella.   Donnice JONELLE Annella, MD 12/01/2023   Virtual Visit via Video Note  I connected with Evelyn Peterson on 12/01/23 at 10:15 AM EDT by a video enabled telemedicine application and verified that I am speaking with the correct person using two identifiers.  Location: Patient: Home Provider: Office - [facility] - [address] [place], [address], [place], [place] [address]  I discussed the limitations of evaluation and management by telemedicine and the availability of in person appointments. The patient expressed understanding and agreed to proceed. I also discussed with the patient that there may be a patient responsible charge related to this service. The patient expressed understanding and agreed to proceed.  Patient consented to consult via telephone: Yes People present and their role in pt  care: Pt

## 2023-12-04 ENCOUNTER — Ambulatory Visit: Payer: Self-pay | Admitting: Nurse Practitioner

## 2023-12-16 ENCOUNTER — Other Ambulatory Visit: Payer: Self-pay

## 2024-02-09 ENCOUNTER — Other Ambulatory Visit: Payer: Self-pay | Admitting: Pulmonary Disease

## 2024-02-09 DIAGNOSIS — J9611 Chronic respiratory failure with hypoxia: Secondary | ICD-10-CM
